# Patient Record
Sex: Female | Born: 1965 | Race: White | Hispanic: No | Marital: Married | Smoking: Former smoker
Health system: Southern US, Community
[De-identification: ages and names within clinical notes are randomized; demographics above are authoritative.]

## PROBLEM LIST (undated history)

## (undated) DIAGNOSIS — S22080A Wedge compression fracture of T11-T12 vertebra, initial encounter for closed fracture: Secondary | ICD-10-CM

## (undated) DIAGNOSIS — E785 Hyperlipidemia, unspecified: Secondary | ICD-10-CM

## (undated) DIAGNOSIS — N870 Mild cervical dysplasia: Secondary | ICD-10-CM

## (undated) DIAGNOSIS — I10 Essential (primary) hypertension: Secondary | ICD-10-CM

## (undated) DIAGNOSIS — M1990 Unspecified osteoarthritis, unspecified site: Secondary | ICD-10-CM

## (undated) DIAGNOSIS — K219 Gastro-esophageal reflux disease without esophagitis: Secondary | ICD-10-CM

## (undated) HISTORY — DX: Mild cervical dysplasia: N87.0

## (undated) HISTORY — DX: Wedge compression fracture of T11-T12 vertebra, initial encounter for closed fracture: S22.080A

---

## 1994-07-14 HISTORY — PX: TUBAL LIGATION: SHX77

## 1998-12-26 ENCOUNTER — Emergency Department (HOSPITAL_COMMUNITY): Admission: EM | Admit: 1998-12-26 | Discharge: 1998-12-26 | Payer: Self-pay | Admitting: Emergency Medicine

## 2001-07-12 ENCOUNTER — Emergency Department (HOSPITAL_COMMUNITY): Admission: EM | Admit: 2001-07-12 | Discharge: 2001-07-12 | Payer: Self-pay | Admitting: Emergency Medicine

## 2002-02-25 ENCOUNTER — Encounter: Admission: RE | Admit: 2002-02-25 | Discharge: 2002-02-25 | Payer: Self-pay | Admitting: Unknown Physician Specialty

## 2002-02-25 ENCOUNTER — Encounter: Payer: Self-pay | Admitting: Unknown Physician Specialty

## 2003-08-10 ENCOUNTER — Emergency Department (HOSPITAL_COMMUNITY): Admission: EM | Admit: 2003-08-10 | Discharge: 2003-08-10 | Payer: Self-pay | Admitting: Emergency Medicine

## 2004-08-14 DIAGNOSIS — N870 Mild cervical dysplasia: Secondary | ICD-10-CM

## 2004-08-14 HISTORY — DX: Mild cervical dysplasia: N87.0

## 2004-08-14 HISTORY — PX: COLPOSCOPY: SHX161

## 2004-11-22 ENCOUNTER — Other Ambulatory Visit: Admission: RE | Admit: 2004-11-22 | Discharge: 2004-11-22 | Payer: Self-pay | Admitting: Obstetrics and Gynecology

## 2005-03-18 ENCOUNTER — Other Ambulatory Visit: Admission: RE | Admit: 2005-03-18 | Discharge: 2005-03-18 | Payer: Self-pay | Admitting: Obstetrics and Gynecology

## 2005-06-20 ENCOUNTER — Other Ambulatory Visit: Admission: RE | Admit: 2005-06-20 | Discharge: 2005-06-20 | Payer: Self-pay | Admitting: Obstetrics and Gynecology

## 2005-11-25 ENCOUNTER — Encounter: Admission: RE | Admit: 2005-11-25 | Discharge: 2005-11-25 | Payer: Self-pay | Admitting: Obstetrics and Gynecology

## 2006-04-27 ENCOUNTER — Ambulatory Visit (HOSPITAL_COMMUNITY): Admission: RE | Admit: 2006-04-27 | Discharge: 2006-04-27 | Payer: Self-pay | Admitting: Family Medicine

## 2006-04-27 ENCOUNTER — Emergency Department (HOSPITAL_COMMUNITY): Admission: EM | Admit: 2006-04-27 | Discharge: 2006-04-27 | Payer: Self-pay | Admitting: Family Medicine

## 2006-08-25 ENCOUNTER — Other Ambulatory Visit: Admission: RE | Admit: 2006-08-25 | Discharge: 2006-08-25 | Payer: Self-pay | Admitting: Obstetrics and Gynecology

## 2007-11-18 ENCOUNTER — Other Ambulatory Visit: Admission: RE | Admit: 2007-11-18 | Discharge: 2007-11-18 | Payer: Self-pay | Admitting: Obstetrics & Gynecology

## 2007-12-13 HISTORY — PX: VAGINAL HYSTERECTOMY: SUR661

## 2008-01-03 ENCOUNTER — Ambulatory Visit (HOSPITAL_COMMUNITY): Admission: RE | Admit: 2008-01-03 | Discharge: 2008-01-04 | Payer: Self-pay | Admitting: Obstetrics & Gynecology

## 2008-01-03 ENCOUNTER — Encounter: Payer: Self-pay | Admitting: Obstetrics & Gynecology

## 2008-11-27 ENCOUNTER — Encounter: Admission: RE | Admit: 2008-11-27 | Discharge: 2008-11-27 | Payer: Self-pay | Admitting: Obstetrics & Gynecology

## 2010-02-27 ENCOUNTER — Encounter: Admission: RE | Admit: 2010-02-27 | Discharge: 2010-02-27 | Payer: Self-pay | Admitting: Obstetrics & Gynecology

## 2010-11-26 NOTE — Discharge Summary (Signed)
NAMEMarland Kitchen  KONNI, KESINGER              ACCOUNT NO.:  [id number]   MEDICAL RECORD NO.:  [id number]          PATIENT TYPE:  OIB   LOCATION:  9303                          FACILITY:  [facility]   PHYSICIAN:  M. Leda Quail, MD  DATE OF BIRTH:  [birth year]   DATE OF ADMISSION:  01/03/2008  DATE OF DISCHARGE:  01/04/2008                               DISCHARGE SUMMARY   Date of Admission:  January 03, 2008  Date of Discharge:  January 04, 2008   PROCEDURE:  Transvaginal hysterectomy.   HISTORY OF PRESENT ILLNESS:  A written H&P is in the chart, but in brief  Ms. Spielberg is a 45 year old G2, P2 married white female with history of  menorrhagia and a fibroid uterus.  She is a smoker and cannot be on oral  contraceptives today, estrogen.  We tried a trial of oral progestins.  Although this did help her bleeding, she had a lot of mood swings and  rage issues with this medication and ultimately decided to discontinue  it.  With discontinuance, her bleeding started back as heavy as it was  before.  She does not like the idea of an IUD and therefore was debating  between endometrial ablation or hysterectomy, and has opted for a  definitive management.  She has had 2 children, her largest weighed 8  pounds 14 ounces and the rest of her children with vaginal deliveries.  I felt that we could either do LAVH or TVH based on how much relaxation  was present when she was asleep.  Risks and benefits have been explained  to the patient, she is ready to proceed.   HOSPITAL COURSE:  The patient was admitted on January 03, 2008, to same-day  surgery.  She was taken to the operating room where a TVH was performed.  The surgery went well and she had about 100 mL of blood loss.  After the  procedure was ended, she was taken to the recovery room and from there  to the third floor for the remainder of her hospitalization.  She was  seen on the evening of postoperative day #0 and was doing very well.  She had no complaints.  She  had some mild nausea earlier.  She had  excellent pain control.  She had made about 900 mL of urine output  during the day.  Vital signs were stable.  She was afebrile.  She had  good bowel sounds and her abdomen was soft.  She had scant bleeding on  her pad.  Foley was discontinued.  She was able to be up and ambulate  and void without difficulty.  Her diet was advanced to regular diet.  In  the morning of postoperative day #1, she had excellent pain control  overnight and was already passing flatus.  She had no complaints.  She  is afebrile with stable vital signs.  She had made 2300 mL of urine  output, most of this voiding on her own.   Postop labs showed a white count of 9.5, hemoglobin of 12.2, and  platelet count of 239.  Her electrolytes  were sodium 136, potassium 4.0,  BUN and creatinine 5 and 0.6, and a glucose of 111.  Her exam was  completely benign.  Her abdomen was soft with excellent bowel sounds.  Perineum was dry.  The patient was voiding, ambulating, tolerating  regular diet, I felt that she could probably be discharged home later  this morning.  She will be advanced to her regular pain medicine.  She  will use Vicodin 5/500, 1-2 tablets p.o. q.4-6 h. p.r.n. pain and Motrin  800 mg 1 every 8 hours as needed for pain.   DISCHARGE INSTRUCTIONS:  These were provided in the written and verbal  form.  The patient has specific reasons to call particularly if she has  a fever greater than 100.5, any heavy bleeding, persistent nausea, or  worsening  pain.  She has numbers to call.  The patient already has her  prescriptions, which are for Vicodin and Motrin as above.  She has a  postop appointment next Monday, on January 10, 2008, at 1:15 with me.  She  will be discharged to home with her husband later this morning.      Lum Keas, MD  Electronically Signed     MSM/MEDQ  D:  01/04/2008  T:  01/04/2008  Job:  [id number]

## 2010-11-26 NOTE — Op Note (Signed)
NAME:  Traci Caldwell, Traci Caldwell              ACCOUNT NO.:  [id number]   MEDICAL RECORD NO.:  [id number]          PATIENT TYPE:  AMB   LOCATION:  SDC                           FACILITY:  [facility]   PHYSICIAN:  M. Leda Quail, MD  DATE OF BIRTH:  [birth year]   DATE OF PROCEDURE:  DATE OF DISCHARGE:                               OPERATIVE REPORT   PREOPERATIVE DIAGNOSES:  25. A 45 year old G2, P2 married white female with history of      menorrhagia.  2. Fibroids.  3. Failed conservative management.  4. Smoker.   POSTOPERATIVE DIAGNOSES:  47. A 45 year old G2, P2 married white female with history of      menorrhagia.  2. Fibroids.  3. Failed conservative management.  4. Smoker.  5. Endometriosis.   PROCEDURE:  Transvaginal hysterectomy.   SURGEON:  M. Leda Quail, MD.   ASSISTANTAram Beecham P. Romine, MD.   ANESTHESIA:  General endotracheal.   FINDINGS:  Globular uterus consistent with fibroids and adenomyosis as  well as left pupil endometriosis.   SPECIMENS:  Cervix and uterus and second specimen of the endometriosis  removed from the left tube.  These were sent separately.   SPECIMEN:  Disposal of Specimens:  Both were sent to pathology.   ESTIMATED BLOOD LOSS:  100 mL.   URINE:  A 200 mL of clear urine output from Foley catheter.   FLUIDS:  2000 mL of LR.   COMPLICATIONS:  None.   INDICATIONS:  A 45 year old G2, P2 married white female with a history  of menorrhagia and fibroids who was seen earlier this year.  She had  been in the clinic in about 2 years.  She complained of  significantly  worsening cycles.  On physical exam, fibroids were noted.  This was  confirmed on ultrasound.  Also, there was evidence on ultrasound  consistent with adenomyosis.  She is a smoker and cannot take estrogen-  containing oral contraceptives.  We discussed treatment options.  I did  not feel an ablation was appropriate for her.  She was not interested in  IUD, so we tried oral  progesterones.  Although, they did help her  bleeding, she had a significant amount of trouble with rage and mood  swings, and I therefore decided to discontinue them and proceed with  definitive management.  Risks and benefits have explained to the  patient.  This was all documented in her office chart and she is here  for the procedure today.   PROCEDURE:  The patient is taken to the operating room.  She is placed  in supine position.  The anesthesia was administered by the anesthesia  staff without difficulty.  Running IV has been placed in her left hand.  Legs positioned in [place] stirrups in the low lithotomy position.  Abdomen, perineum, inner thighs, and vagina prepped and draped in normal  sterile fashion.  Exam under anesthesia is performed and there is good  descensus of the uterus.  Initially, this was posted as a LAVH, but I  felt this could be done vaginally.  Foley catheter was inserted in  the  bladder under sterile conditions.  Legs were positioned in the high  lithotomy position.   Attention is turned to the vagina.  A short heavy-weighted speculum was  placed in the posterior vagina.  Cervix was grasped with a Christella Hartigan  tenaculum.  A 1% lidocaine mixed 1:1 with epinephrine (1:100,000 units)  is instilled circumferentially around the cervix.  The posterior cul-de-  sac was entered sharply.  A figure-of-eight suture is used to attach  posterior peritoneum to the posterior vaginal mucosa.  Long Banano  speculum was then placed in this incision and the short heavy weighted  speculum was removed.  The cervix was circumferentially incised using  cautery.  Then, the anterior vaginal mucosa was visualized.  Using sharp  dissection with curved Mayo scissors beginning at the pubovesical  cervical fascia, dissection is performed.  The plane between the cervix  and the bladder was easily noted.  A Kirby retractor was placed in this  incision and elevated to keep the bladder up out of  the way.   A curved Heaney clamps were used to clamp the uterosacral ligaments  bilaterally.  These pedicles were clamped, transected, and suture  ligated with Heaney stitch of #0 Vicryl.  These were left long.  The  beginning of the cardinal ligament dissection was performed on each side  with care taken with this.  Pedicles were clamped, transected, and  suture ligated with # 0 Vicryl.  Further dissection of the pubovesical  cervical fascia was performed anteriorly as necessary to keep the  bladder out of the way of the dissection.  The cardinal ligaments at  this point were serially clamped, transected, and suture ligated to the  level of the uterine arteries.  These pedicles were then clamped on each  side, transected, and doubly suture ligated with #0 Vicryl.  At this  point, the anterior peritoneum reflection could easily be visualized,  entered sharply and a Kirby retractor was placed in this anteriorly.  This lifted up the bladder.  Then, broad ligament dissection was  performed.  This was necessary to get high enough on the sides of the  uterus, so the uterus could be flipped.  Again, these pedicles were  clamped, transected, and suture ligated with 0-Vicryl.  Once high enough  from the uterus, the fundus of the uterus was grasped with thyroid  tenaculum and delivered through the vaginal incision.  Moist laparotomy  tape was used to push the bowel up out of the way.  Curved Heaney clamps  were placed across the utero-ovarian pedicles bilaterally.  These  pedicles were transected and the uterus and cervix were handed off to be  sent to pathology.  Attention was initially turned to the right utero-  ovarian pedicle.  This was suture ligated first with free tie of #0  Vicryl and a stitch tie.  There was a small amount of bleeding that was  up higher on the pedicle where some of the broad ligament had torn.  This bleeding was grasped with Allis clamp.  Two figure-of-eight sutures   of #2-0 Vicryl were necessary to make this complete hemostatic.  The  pedicle was then left and watched with no bleeding being noted.  Then,  in a similar fashion on the right pedicle, the pedicle was tied with a  free tie of #0 Vicryl and then a stitch tie of #0 Vicryl.  This pedicle  was also held onto to watch for any bleeding.  It was completely  hemostatic.  There was a small amount of bleeding on the posterior cuff,  but this would be incorporated into the stitch around the cuff.  At this  point, before the pedicles were cut to release the ovaries, there was a  small bluish-looking area on the fallopian tube on the patient's left.  This almost looked like an inclusion cyst.  It was opened with cautery  and there was some endometriotic-looking fluid there.  The area was  elevated, incised off using cautery.  The area on the tube was made  hemostatic with cautery.  This tissue specimen was sent off as a left  fallopian tube endometriosis specimen.  At this point, the short  weighted speculum was placed back in the vagina.  Anterior peritoneum  was grasped and using a long #0 stitch, the cuff was run starting at the  2 o'clock position incorporating the anterior mucosa and the anterior  peritoneum.  The stitch was run around the cervix in a running  interlocking fashion.  I incorporated the posterior vaginal mucosa and  posterior peritoneum and then end at the 10 o'clock position.  Stitch  was tied tightly.  Warm normal saline was used to irrigate the vaginal  incision.  There was small amount of bleeding on the cuff that was it.  The pedicles including the cardinal, the uterine artery, and the utero-  ovarian pedicles appeared hemostatic.   A enterocele stitch was placed using a long #0 stitch.  This was brought  to the posterior vaginal mucosa and posterior peritoneum.  The medial  third of the right uterosacral ligament was incorporated.  The posterior  peritoneum was reached across  and the medial third of the right  uterosacral ligament was incorporated.  The stitch was brought back to  the posterior vaginal mucosa was tied tightly bringing the uterosacral  ligaments together with posterior vaginal mucosa.   The cuff was then closed with 4 figure-of-eight sutures of #0 Vicryl.  The cuff appeared hemostatic.  The vagina was irrigated.  No bleeding  was noted.   The legs were positioned back in the supine position.  The Betadine was  cleansed off the skin.  Sponge, lap, needle, and the instrument counts  were correct x2.  The patient tolerated the procedure well.  She was  then awakened and extubated from anesthesia without difficulty.      Lum Keas, MD  Electronically Signed     MSM/MEDQ  D:  01/03/2008  T:  01/04/2008  Job:  [id number]

## 2010-11-29 NOTE — Discharge Summary (Signed)
NAMEMarland Kitchen  Traci Caldwell, Traci Caldwell              ACCOUNT NO.:  [id number]   MEDICAL RECORD NO.:  [id number]          PATIENT TYPE:  OIB   LOCATION:  9303                          FACILITY:  [facility]   PHYSICIAN:  M. Leda Quail, MD  DATE OF BIRTH:  17-Apr-1966   DATE OF ADMISSION:  01/03/2008  DATE OF DISCHARGE:  01/04/2008                               DISCHARGE SUMMARY   ADMISSION DIAGNOSES:  63. A 44 year old G2, P2 married white female with menorrhagia.  2. Fibroid uterus.  3. Smoking history.  4. Inability to tolerate progestin therapy.  5. History of dysplasia of the cervix.   DISCHARGE DIAGNOSES:  42. A 45 year old G2, P2 married white female with menorrhagia.  2. Fibroid uterus.  3. Smoking history.  4. Inability to tolerate progestin therapy.  5. History of dysplasia of the cervix.   HISTORY OF PRESENT ILLNESS:  In brief, Traci Caldwell is a 45 year old G2,  P2 married white female with menorrhagia and fibroid uterus.  She has  been treated conservatively with progestin therapy, which did improve  her cycle.  She has had difficulty tolerating due to the side effects.  She has ultimately decided to proceed with a definitive management.  She  is a noncandidate for estrogen and oral contraceptives because of  smoking and based on her exam, I do feel that this is a sensible choice  for her to make.  A written H&P is in the chart.   HOSPITAL COURSE:  The patient is admitted to Same-Day Surgery.  She was  taken to operating room where a transvaginal hysterectomy was performed  without difficulty.  She did have globular uterus consistent with  fibroids and adenomyosis.  The left tube did have endometriosis present  as well, and this was excised and sent to pathology.  She had  approximately 100 mL of blood loss and did very well during the surgery.  After, she was taken from the operating room to the recovery room and  from there to the third floor for the remainder of her hospitalization.  She  had a benign postoperative course.  In the morning of postop day #1,  she was doing well.  She had stable vital signs.  She made 2300 mL of  urine output.  Abdomen was soft and showed excellent bowel sounds.  She  had minimal bleeding.  Postop labs showed a white count of 95,  hemoglobin of 12.2, and platelets of 239.  Sodium was 136, potassium  4.0, BUN and creatinine 5 and 0.6 respectively, and glucose of 1.1.  She  was able to transition to a regular diet very easily and to transition  to oral pain medicines.  She was able to ambulate without difficulty and  was able to take all medicines by mouth.  Her Foley catheter was  discontinued.  She was able to void well.  She was also on the PCA and  her IV fluids were removed.  By midday, I felt she was stable for  discharge to home.  She was sent home with her husband.   DISCHARGE MEDICATIONS:  Motrin 800 mg  1 p.o. q.8 h. p.r.n. pain; #30, no  refills, and Vicodin 5/500 one each tab p.o. q.4-6 h. p.r.n. pain.  The  patient will see me in 1 week for followup appointment.  She has been  given written instructions as well as verbal instructions.      Traci Keas, MD  Electronically Signed     MSM/MEDQ  D:  01/24/2008  T:  01/24/2008  Job:  [id number]

## 2011-03-04 ENCOUNTER — Other Ambulatory Visit: Payer: Self-pay | Admitting: Obstetrics & Gynecology

## 2011-03-04 DIAGNOSIS — Z1231 Encounter for screening mammogram for malignant neoplasm of breast: Secondary | ICD-10-CM

## 2011-03-05 ENCOUNTER — Ambulatory Visit
Admission: RE | Admit: 2011-03-05 | Discharge: 2011-03-05 | Disposition: A | Discharge status: Home / Self Care | Payer: BC Managed Care – PPO | Source: Ambulatory Visit | Attending: Obstetrics & Gynecology | Admitting: Obstetrics & Gynecology

## 2011-03-05 DIAGNOSIS — Z1231 Encounter for screening mammogram for malignant neoplasm of breast: Secondary | ICD-10-CM

## 2011-04-10 LAB — URINALYSIS, ROUTINE W REFLEX MICROSCOPIC
Glucose, UA: NEGATIVE
Hgb urine dipstick: NEGATIVE
Specific Gravity, Urine: <1.005 — ABNORMAL LOW
Urobilinogen, UA: 0.2
pH: 5.5

## 2011-04-10 LAB — BASIC METABOLIC PANEL
BUN: 5 — ABNORMAL LOW
CO2: 21
Calcium: 8.1 — ABNORMAL LOW
Calcium: 9.4
Creatinine, Ser: 0.65
Creatinine, Ser: 0.66
GFR calc Af Amer: >60
GFR calc non Af Amer: >60
Glucose, Bld: 111 — ABNORMAL HIGH
Sodium: 136

## 2011-04-10 LAB — CBC
MCHC: 34.2
Platelets: 239
RBC: 4.69
RDW: 13.2
RDW: 13.3

## 2011-04-10 LAB — PROTIME-INR
INR: 0.9
Prothrombin Time: 12.3

## 2011-04-10 LAB — TYPE AND SCREEN
ABO/RH(D): O POS
Antibody Screen: NEGATIVE

## 2011-04-10 LAB — ABO/RH: ABO/RH(D): O POS

## 2012-01-27 ENCOUNTER — Other Ambulatory Visit: Payer: Self-pay | Admitting: Obstetrics & Gynecology

## 2012-01-27 DIAGNOSIS — Z1231 Encounter for screening mammogram for malignant neoplasm of breast: Secondary | ICD-10-CM

## 2012-03-08 ENCOUNTER — Ambulatory Visit
Admission: RE | Admit: 2012-03-08 | Discharge: 2012-03-08 | Disposition: A | Discharge status: Home / Self Care | Payer: BC Managed Care – PPO | Source: Ambulatory Visit | Attending: Obstetrics & Gynecology | Admitting: Obstetrics & Gynecology

## 2012-03-08 DIAGNOSIS — Z1231 Encounter for screening mammogram for malignant neoplasm of breast: Secondary | ICD-10-CM

## 2012-11-15 ENCOUNTER — Ambulatory Visit: Payer: Self-pay | Admitting: Obstetrics & Gynecology

## 2013-01-12 ENCOUNTER — Ambulatory Visit (INDEPENDENT_AMBULATORY_CARE_PROVIDER_SITE_OTHER): Payer: BC Managed Care – PPO | Admitting: Obstetrics & Gynecology

## 2013-01-12 ENCOUNTER — Encounter: Payer: Self-pay | Admitting: Obstetrics & Gynecology

## 2013-01-12 VITALS — BP 126/76 | HR 70 | Resp 14 | Ht 64.0 in | Wt 177.0 lb

## 2013-01-12 DIAGNOSIS — Z0000 Encounter for general adult medical examination without abnormal findings: Secondary | ICD-10-CM

## 2013-01-12 DIAGNOSIS — Z01419 Encounter for gynecological examination (general) (routine) without abnormal findings: Secondary | ICD-10-CM

## 2013-01-12 LAB — POCT URINALYSIS DIPSTICK
Leukocytes, UA: NEGATIVE
Urobilinogen, UA: NEGATIVE

## 2013-01-12 LAB — HEMOGLOBIN, FINGERSTICK: Hemoglobin, fingerstick: 15.3 g/dL (ref 12.0–16.0)

## 2013-01-12 NOTE — Patient Instructions (Signed)

## 2013-01-12 NOTE — Progress Notes (Signed)
47 y.o. [id number] MarriedCaucasianF here for annual exam.  No vaginal bleeding.  Still not smoking!!!  Husband has urologist surgery this afternoon.  Doing really well.  Occasionally has night sweats.  Sleeping OK.  Not interested in HRT right now.   No LMP recorded. Patient has had a hysterectomy.          Sexually active: yes  The current method of family planning is status post hysterectomy.    Exercising: yes  is active at work Smoker:  No former smoker quit 3 years ago  Health Maintenance: Pap:  11/18/07 - NEG, TVH History of abnormal Pap:  Yes ASCUS 06/2004; Colpo 08/2004 CIN I MMG:  03/08/12 Colonoscopy:  no BMD:   no TDaP:  9 years ago  Screening Labs: yes , Hb today: 15.3  Urine today: Negative   reports that she has quit smoking. She does not have any smokeless tobacco history on file. She reports that she does not drink alcohol or use illicit drugs.  Past Medical History  Diagnosis Date  . 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 Vaginal hysterectomy  12/2007    TVH- due to fibroids & bleeding  . Tubal ligation  1996    Current Outpatient Prescriptions  Medication Sig Dispense Refill  . Ibuprofen (ADVIL PO) Take by mouth as needed.       No current facility-administered medications for this visit.    Family History  Problem Relation Age of Onset  . Hypertension Mother   . Hypertension Father   . Endometriosis Sister     ROS:  Pertinent items are noted in HPI.  Otherwise, a comprehensive ROS was negative.  Exam:   BP 126/76  Pulse 70  Resp 14  Ht 5\' 4"  (1.626 m)  Wt 177 lb (80.287 kg)  BMI 30.37 kg/m2  Weight change +1  Height: 5\' 4"  (162.6 cm)  Ht Readings from Last 3 Encounters:  01/12/13 5\' 4"  (1.626 m)    General appearance: alert, cooperative and appears stated age Head: Normocephalic, without obvious abnormality, atraumatic Neck: no adenopathy, supple,  symmetrical, trachea midline and thyroid normal to inspection and palpation Lungs: clear to auscultation bilaterally Breasts: normal appearance, no masses or tenderness Heart: regular rate and rhythm Abdomen: soft, non-tender; bowel sounds normal; no masses,  no organomegaly Extremities: extremities normal, atraumatic, no cyanosis or edema Skin: Skin color, texture, turgor normal. No rashes or lesions Lymph nodes: Cervical, supraclavicular, and axillary nodes normal. No abnormal inguinal nodes palpated Neurologic: Grossly normal   Pelvic: External genitalia:  no lesions              Urethra:  normal appearing urethra with no masses, tenderness or lesions              Bartholins and Skenes: normal                 Vagina: normal appearing vagina with normal color and discharge, no lesions              Cervix: absent              Pap taken: no Bimanual Exam:  Uterus:  uterus absent              Adnexa: normal adnexa and no mass, fullness, tenderness               Rectovaginal: Confirms  Anus:  normal sphincter tone, no lesions  A:  Well Woman with normal exam H/O TVH Former smoker Mild vasomotor symptoms  P:   Mammogram yearly. No pap smear. Labs last year good. return annually or prn  An After Visit Summary was printed and given to the patient.

## 2014-02-06 ENCOUNTER — Other Ambulatory Visit: Payer: Self-pay

## 2014-02-06 DIAGNOSIS — Z1231 Encounter for screening mammogram for malignant neoplasm of breast: Secondary | ICD-10-CM

## 2014-02-13 ENCOUNTER — Encounter (HOSPITAL_COMMUNITY): Payer: Self-pay | Admitting: Emergency Medicine

## 2014-02-13 ENCOUNTER — Emergency Department (INDEPENDENT_AMBULATORY_CARE_PROVIDER_SITE_OTHER)
Admission: EM | Admit: 2014-02-13 | Discharge: 2014-02-13 | Disposition: A | Discharge status: Home / Self Care | Payer: BC Managed Care – PPO | Source: Home / Self Care | Attending: Family Medicine | Admitting: Family Medicine

## 2014-02-13 DIAGNOSIS — J069 Acute upper respiratory infection, unspecified: Secondary | ICD-10-CM

## 2014-02-13 MED ORDER — GUAIFENESIN-DM 100-10 MG/5ML PO SYRP: 5.0000 mL | ORAL_SOLUTION | ORAL | Status: DC | PRN

## 2014-02-13 NOTE — ED Provider Notes (Signed)
CSN: [id number]     Arrival date & time 02/13/14  1311 History   First MD Initiated Contact with Patient 02/13/14 1534     Chief Complaint  Patient presents with  . URI   (Consider location/radiation/quality/duration/timing/severity/associated sxs/prior Treatment) Patient is a 48 y.o. female presenting with URI. The history is provided by the patient.  URI Presenting symptoms: cough and rhinorrhea   Presenting symptoms: no ear pain, no facial pain, no fever and no sore throat   Severity:  Mild Onset quality:  Sudden Duration:  4 days Timing:  Intermittent Progression:  Unchanged Chronicity:  New Relieved by:  Decongestant Associated symptoms: headaches   Associated symptoms: no neck pain, no swollen glands and no wheezing   Risk factors: no sick contacts     Past Medical History  Diagnosis Date  . 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 Vaginal hysterectomy  12/2007    TVH- due to fibroids & bleeding  . Tubal ligation  1996  . Colposcopy  08/2004    CIN I   Family History  Problem Relation Age of Onset  . Hypertension Mother   . Hypertension Father   . Endometriosis Sister    History  Substance Use Topics  . Smoking status: Former Research scientist (life sciences)  . Smokeless tobacco: Not on file  . Alcohol Use: No   OB History   Grav Para Term Preterm Abortions TAB SAB Ect Mult Living   2 2 2       2      Review of Systems  Constitutional: Negative for fever and activity change.  HENT: Positive for rhinorrhea. Negative for ear pain and sore throat.   Respiratory: Positive for cough. Negative for wheezing.   Musculoskeletal: Negative for neck pain.  Skin: Negative for rash.  Neurological: Positive for headaches.    Allergies  Review of patient's allergies indicates no known allergies.  Home Medications   Prior to Admission medications   Medication Sig Start Date End Date Taking?  Authorizing Provider  guaiFENesin-dextromethorphan (ROBITUSSIN DM) 100-10 MG/5ML syrup Take 5 mLs by mouth every 4 (four) hours as needed for cough. 02/13/14   Rosemarie Ax, MD  Ibuprofen (ADVIL PO) Take by mouth as needed.    Historical Provider, MD   BP 173/110  Pulse 66  Temp(Src) 98.7 F (37.1 C) (Oral)  Resp 16  Ht 5\' 3"  (1.6 m)  Wt 170 lb (77.111 kg)  BMI 30.12 kg/m2  SpO2 99% Physical Exam  Constitutional: She is oriented to person, place, and time. She appears well-developed and well-nourished. No distress.  HENT:  Head: Normocephalic and atraumatic.  Mouth/Throat: No oropharyngeal exudate.  Turbinates erythematous b/l.   Neck: Normal range of motion. Neck supple.  Cardiovascular: Normal rate and regular rhythm.   Pulmonary/Chest: Effort normal and breath sounds normal. She has no wheezes. She has no rales.  Lymphadenopathy:    She has no cervical adenopathy.  Neurological: She is alert and oriented to person, place, and time.  Skin: Skin is warm and dry. No rash noted.    ED Course  Procedures (including critical care time) Labs Review Labs Reviewed - No data to display  Imaging Review No results found.   MDM   1. URI (upper respiratory infection)    Symptoms consistent for URI with possible allergy component. Will try guaifenesin. Patient has advil cold and sinus at home that she will also  try. Nasal saline rinse and gargling with salt water are other things she could try that would be cost effective.      Rosemarie Ax, MD 02/13/14 1600

## 2014-02-13 NOTE — ED Notes (Signed)
Pt c/o cold sx onset Friday  Sx include: ST, dry cough, HA, runny nose Denies f/v/n/d Taking OTC cold meds w/no relief Alert w/no signs of acute distress.

## 2014-02-13 NOTE — ED Provider Notes (Signed)
Medical screening examination/treatment/procedure(s) were performed by resident physician or non-physician practitioner and as supervising physician I was immediately available for consultation/collaboration.   Pauline Good MD.   Billy Fischer, MD 02/13/14 2028

## 2014-02-13 NOTE — Discharge Instructions (Signed)
Upper Respiratory Infection, Adult An upper respiratory infection (URI) is also known as the common cold. It is often caused by a type of germ (virus). Colds are easily spread (contagious). You can pass it to others by kissing, coughing, sneezing, or drinking out of the same glass. Usually, you get better in 1 or 2 weeks.  HOME CARE   Only take medicine as told by your doctor.  Use a warm mist humidifier or breathe in steam from a hot shower.  Drink enough water and fluids to keep your pee (urine) clear or pale yellow.  Get plenty of rest.  Return to work when your temperature is back to normal or as told by your doctor. You may use a face mask and wash your hands to stop your cold from spreading. GET HELP RIGHT AWAY IF:   After the first few days, you feel you are getting worse.  You have questions about your medicine.  You have chills, shortness of breath, or brown or red spit (mucus).  You have yellow or brown snot (nasal discharge) or pain in the face, especially when you bend forward.  You have a fever, puffy (swollen) neck, pain when you swallow, or white spots in the back of your throat.  You have a bad headache, ear pain, sinus pain, or chest pain.  You have a high-pitched whistling sound when you breathe in and out (wheezing).  You have a lasting cough or cough up blood.  You have sore muscles or a stiff neck. MAKE SURE YOU:   Understand these instructions.  Will watch your condition.  Will get help right away if you are not doing well or get worse. Document Released: 12/17/2007 Document Revised: 09/22/2011 Document Reviewed: 10/05/2013 Exit[employer] Patient Information 2015 [employer]. This information is not intended to replace advice given to you by your health care provider. Make sure you discuss any questions you have with your health care provider.  

## 2014-03-08 ENCOUNTER — Ambulatory Visit
Admission: RE | Admit: 2014-03-08 | Discharge: 2014-03-08 | Disposition: A | Discharge status: Home / Self Care | Payer: BC Managed Care – PPO | Source: Ambulatory Visit

## 2014-03-08 DIAGNOSIS — Z1231 Encounter for screening mammogram for malignant neoplasm of breast: Secondary | ICD-10-CM

## 2014-03-09 ENCOUNTER — Other Ambulatory Visit: Payer: Self-pay | Admitting: Obstetrics & Gynecology

## 2014-03-09 DIAGNOSIS — R928 Other abnormal and inconclusive findings on diagnostic imaging of breast: Secondary | ICD-10-CM

## 2014-03-16 ENCOUNTER — Ambulatory Visit
Admission: RE | Admit: 2014-03-16 | Discharge: 2014-03-16 | Disposition: A | Discharge status: Home / Self Care | Payer: BC Managed Care – PPO | Source: Ambulatory Visit | Attending: Obstetrics & Gynecology | Admitting: Obstetrics & Gynecology

## 2014-03-16 DIAGNOSIS — R928 Other abnormal and inconclusive findings on diagnostic imaging of breast: Secondary | ICD-10-CM

## 2014-03-21 ENCOUNTER — Other Ambulatory Visit: Payer: BC Managed Care – PPO

## 2014-03-21 ENCOUNTER — Ambulatory Visit (INDEPENDENT_AMBULATORY_CARE_PROVIDER_SITE_OTHER): Payer: BC Managed Care – PPO | Admitting: Obstetrics & Gynecology

## 2014-03-21 ENCOUNTER — Encounter: Payer: Self-pay | Admitting: Obstetrics & Gynecology

## 2014-03-21 VITALS — BP 128/82 | HR 60 | Resp 16 | Ht 64.0 in | Wt 178.6 lb

## 2014-03-21 DIAGNOSIS — Z01419 Encounter for gynecological examination (general) (routine) without abnormal findings: Secondary | ICD-10-CM

## 2014-03-21 DIAGNOSIS — Z0000 Encounter for general adult medical examination without abnormal findings: Secondary | ICD-10-CM

## 2014-03-21 LAB — POCT URINALYSIS DIPSTICK
BILIRUBIN UA: NEGATIVE
GLUCOSE UA: NEGATIVE
KETONES UA: NEGATIVE
Leukocytes, UA: NEGATIVE
Nitrite, UA: NEGATIVE
Protein, UA: NEGATIVE
UROBILINOGEN UA: NEGATIVE
pH, UA: 5

## 2014-03-21 LAB — HEMOGLOBIN, FINGERSTICK: HEMOGLOBIN, FINGERSTICK: 14.2 g/dL (ref 12.0–16.0)

## 2014-03-21 NOTE — Progress Notes (Signed)
48 y.o. [id number] MarriedCaucasianF here for annual exam.  Doing well.  Had "abnormal MMG" in 9/15.  Follow up was done 03/16/14.  This was all normal.  No vaginal bleeding.     Patient's last menstrual period was 12/13/2007.          Sexually active: Yes.    The current method of family planning is status post hysterectomy.    Exercising: Yes.    walking Smoker:  Former smoker  Health Maintenance: Pap:  11/18/07 WNL History of abnormal Pap:  Yes h/o CIN I.  Pathology with hysterectomy was normal.   MMG:  03/08/14-MMG, 03/16/14-left diag-screening in one year Colonoscopy:  none BMD:   none TDaP:  2007 Screening Labs: 2013, Hb today: 14.2, Urine today: RBC-trace   reports that she has quit smoking. She has never used smokeless tobacco. She reports that she does not drink alcohol or use illicit drugs.  Past Medical History  Diagnosis Date  . 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 Vaginal hysterectomy  12/2007    TVH- due to fibroids & bleeding  . Tubal ligation  1996  . Colposcopy  08/2004    CIN I    Current Outpatient Prescriptions  Medication Sig Dispense Refill  . Ibuprofen (ADVIL PO) Take by mouth as needed.       No current facility-administered medications for this visit.    Family History  Problem Relation Age of Onset  . Hypertension Mother   . Hypertension Father   . Endometriosis Sister     ROS:  Pertinent items are noted in HPI.  Otherwise, a comprehensive ROS was negative.  Exam:   BP 128/82  Pulse 60  Resp 16  Ht 5\' 4"  (1.626 m)  Wt 178 lb 9.6 oz (81.012 kg)  BMI 30.64 kg/m2  LMP 12/13/2007  Weight change:  -1# Height: 5\' 4"  (162.6 cm)  Ht Readings from Last 3 Encounters:  03/21/14 5\' 4"  (1.626 m)  02/13/14 5\' 3"  (1.6 m)  01/12/13 5\' 4"  (1.626 m)    General appearance: alert, cooperative and appears stated age Head: Normocephalic, without obvious  abnormality, atraumatic Neck: no adenopathy, supple, symmetrical, trachea midline and thyroid normal to inspection and palpation Lungs: clear to auscultation bilaterally Breasts: normal appearance, no masses or tenderness Heart: regular rate and rhythm Abdomen: soft, non-tender; bowel sounds normal; no masses,  no organomegaly Extremities: extremities normal, atraumatic, no cyanosis or edema Skin: Skin color, texture, turgor normal. No rashes or lesions Lymph nodes: Cervical, supraclavicular, and axillary nodes normal. No abnormal inguinal nodes palpated Neurologic: Grossly normal   Pelvic: External genitalia:  no lesions              Urethra:  normal appearing urethra with no masses, tenderness or lesions              Bartholins and Skenes: normal                 Vagina: normal appearing vagina with normal color and discharge, no lesions              Cervix: absent              Pap taken: No. Bimanual Exam:  Uterus:  uterus absent              Adnexa: no mass, fullness, tenderness  Rectovaginal: Confirms               Anus:  normal sphincter tone, no lesions  A:  Well Woman with normal exam  H/O TVH  Former smoker  Mild vasomotor symptoms   P: Mammogram yearly.  No pap smear.  Labs normal 2013.  No labs today. return annually or prn   An After Visit Summary was printed and given to the patient.

## 2014-05-15 ENCOUNTER — Encounter: Payer: Self-pay | Admitting: Obstetrics & Gynecology

## 2015-03-26 ENCOUNTER — Other Ambulatory Visit: Payer: Self-pay

## 2015-03-26 DIAGNOSIS — Z1231 Encounter for screening mammogram for malignant neoplasm of breast: Secondary | ICD-10-CM

## 2015-04-16 ENCOUNTER — Ambulatory Visit
Admission: RE | Admit: 2015-04-16 | Discharge: 2015-04-16 | Disposition: A | Discharge status: Home / Self Care | Payer: BLUE CROSS/BLUE SHIELD | Source: Ambulatory Visit

## 2015-04-16 DIAGNOSIS — Z1231 Encounter for screening mammogram for malignant neoplasm of breast: Secondary | ICD-10-CM

## 2015-04-17 ENCOUNTER — Ambulatory Visit: Payer: Self-pay

## 2015-04-27 ENCOUNTER — Encounter: Payer: Self-pay | Admitting: Obstetrics & Gynecology

## 2015-04-27 ENCOUNTER — Ambulatory Visit (INDEPENDENT_AMBULATORY_CARE_PROVIDER_SITE_OTHER): Payer: BLUE CROSS/BLUE SHIELD | Admitting: Obstetrics & Gynecology

## 2015-04-27 VITALS — BP 142/90 | HR 64 | Resp 16 | Ht 63.75 in | Wt 181.0 lb

## 2015-04-27 DIAGNOSIS — Z01419 Encounter for gynecological examination (general) (routine) without abnormal findings: Secondary | ICD-10-CM | POA: Diagnosis not present

## 2015-04-27 DIAGNOSIS — Z0000 Encounter for general adult medical examination without abnormal findings: Secondary | ICD-10-CM | POA: Diagnosis not present

## 2015-04-27 LAB — COMPREHENSIVE METABOLIC PANEL
ALT: 11 U/L (ref 6–29)
AST: 13 U/L (ref 10–35)
Albumin: 4.1 g/dL (ref 3.6–5.1)
Alkaline Phosphatase: 55 U/L (ref 33–115)
BUN: 9 mg/dL (ref 7–25)
CHLORIDE: 104 mmol/L (ref 98–110)
CO2: 24 mmol/L (ref 20–31)
Calcium: 9.4 mg/dL (ref 8.6–10.2)
Creat: 0.68 mg/dL (ref 0.50–1.10)
GLUCOSE: 80 mg/dL (ref 65–99)
POTASSIUM: 3.8 mmol/L (ref 3.5–5.3)
Sodium: 138 mmol/L (ref 135–146)
TOTAL PROTEIN: 6.8 g/dL (ref 6.1–8.1)
Total Bilirubin: 0.9 mg/dL (ref 0.2–1.2)

## 2015-04-27 LAB — LIPID PANEL
Cholesterol: 224 mg/dL — ABNORMAL HIGH (ref 125–200)
HDL: 76 mg/dL (ref >=46)
LDL CALC: 133 mg/dL — AB (ref <130)
Total CHOL/HDL Ratio: 2.9 Ratio (ref <=5.0)
Triglycerides: 76 mg/dL (ref <150)
VLDL: 15 mg/dL (ref <30)

## 2015-04-27 LAB — POCT URINALYSIS DIPSTICK
Bilirubin, UA: NEGATIVE
Glucose, UA: NEGATIVE
KETONES UA: NEGATIVE
Leukocytes, UA: NEGATIVE
Nitrite, UA: NEGATIVE
PH UA: 7
PROTEIN UA: NEGATIVE
RBC UA: NEGATIVE
UROBILINOGEN UA: NEGATIVE

## 2015-04-27 LAB — HEMOGLOBIN, FINGERSTICK: HEMOGLOBIN, FINGERSTICK: 13.5 g/dL (ref 12.0–16.0)

## 2015-04-27 LAB — TSH: TSH: 1.676 u[IU]/mL (ref 0.350–4.500)

## 2015-04-27 NOTE — Addendum Note (Signed)
Addended by: Robley Fries on: 04/27/2015 03:10 PM   Modules accepted: Miquel Dunn

## 2015-04-27 NOTE — Progress Notes (Signed)
49 y.o. [id number] MarriedCaucasianF here for annual exam.  Denies vaginal bleeding.  Pt doing well.  Does need blood work done today.    Patient's last menstrual period was 12/13/2007.          Sexually active: Yes.    The current method of family planning is status post hysterectomy.    Exercising: Yes.    running at work all day Smoker:  Former smoker  Health Maintenance: Pap:  5/09 WNL  History of abnormal Pap:  Yes ASCUS 12/05, Colpo 2/06 CIN I MMG:  04/16/15 BiRads 1-negative Colonoscopy:  none BMD:   none TDaP:  2007 Screening Labs: today, Hb today: 13.5, Urine today: PH-7.0   reports that she has quit smoking. She has never used smokeless tobacco. She reports that she does not drink alcohol or use illicit drugs.  Past Medical History  Diagnosis Date  . 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 Vaginal hysterectomy  12/2007    TVH- due to fibroids & bleeding  . Tubal ligation  1996  . Colposcopy  08/2004    CIN I    Current Outpatient Prescriptions  Medication Sig Dispense Refill  . Ibuprofen (ADVIL PO) Take by mouth as needed.     No current facility-administered medications for this visit.    Family History  Problem Relation Age of Onset  . Hypertension Mother   . Hypertension Father   . Endometriosis Sister     ROS:  Pertinent items are noted in HPI.  Otherwise, a comprehensive ROS was negative.  Exam:   BP 150/90 mmHg  Pulse 64  Resp 16  Ht 5' 3.75" (1.619 m)  Wt 181 lb (82.101 kg)  BMI 31.32 kg/m2  LMP 12/13/2007  Weight change: +3#  Height: 5' 3.75" (161.9 cm)  Ht Readings from Last 3 Encounters:  04/27/15 5' 3.75" (1.619 m)  03/21/14 5\' 4"  (1.626 m)  02/13/14 5\' 3"  (1.6 m)    General appearance: alert, cooperative and appears stated age Head: Normocephalic, without obvious abnormality, atraumatic Neck: no adenopathy, supple, symmetrical, trachea midline  and thyroid normal to inspection and palpation Lungs: clear to auscultation bilaterally Breasts: normal appearance, no masses or tenderness Heart: regular rate and rhythm Abdomen: soft, non-tender; bowel sounds normal; no masses,  no organomegaly Extremities: extremities normal, atraumatic, no cyanosis or edema Skin: Skin color, texture, turgor normal. No rashes or lesions Lymph nodes: Cervical, supraclavicular, and axillary nodes normal. No abnormal inguinal nodes palpated Neurologic: Grossly normal   Pelvic: External genitalia:  no lesions              Urethra:  normal appearing urethra with no masses, tenderness or lesions              Bartholins and Skenes: normal                 Vagina: normal appearing vagina with normal color and discharge, no lesions              Cervix: absent              Pap taken: No. Bimanual Exam:  Uterus:  uterus absent              Adnexa: normal adnexa and no mass, fullness, tenderness               Rectovaginal: Confirms  Anus:  normal sphincter tone, no lesions  Chaperone was present for exam.  A:  Well Woman with normal exam  H/O TVH, CIN 1 on biopsy 2006, negative pathology with hysterctomy  Former smoker  Grade D breast density  P: Mammogram yearly.  D/w pt 3D MMG due to breast density. No pap smear CMP, TSH, Vit D, Lipids today Pt will start taking AM BP.  If > 140/90, she will let me know. Return annually or prn

## 2015-04-28 LAB — VITAMIN D 25 HYDROXY (VIT D DEFICIENCY, FRACTURES): Vit D, 25-Hydroxy: 8 ng/mL — ABNORMAL LOW (ref 30–100)

## 2015-04-30 ENCOUNTER — Telehealth: Payer: Self-pay

## 2015-04-30 MED ORDER — VITAMIN D (ERGOCALCIFEROL) 1.25 MG (50000 UNIT) PO CAPS: 50000.0000 [IU] | ORAL_CAPSULE | ORAL | Status: DC

## 2015-04-30 NOTE — Telephone Encounter (Signed)
Patient returning [facility] call she can be reached at [phone] ext [phone]

## 2015-04-30 NOTE — Telephone Encounter (Signed)
Patient notified of results. RX for Vitamin D 50,000IU weekly sent to pharmacy. 3 month recheck scheduled for 07/20/15.//kn

## 2015-04-30 NOTE — Telephone Encounter (Signed)
Lmtcb//kn 

## 2015-04-30 NOTE — Telephone Encounter (Signed)
-----   Message from Megan Salon, MD sent at 04/29/2015  1:57 PM EDT ----- Please inform Vit D very low.  Needs 50K weekly x 12 weeks and repeat 3 months.  CMP normal . TSH normal.  Lipids mildly elevated with total 224 and LDL 133 but HDS are good and ratio is good.  Repeat 1-2 years but should repeat fasting.

## 2015-06-12 ENCOUNTER — Telehealth: Payer: Self-pay

## 2015-06-12 NOTE — Telephone Encounter (Signed)
-----   Message from Megan Salon, MD sent at 04/30/2015  5:31 PM EDT ----- This is ok.  Please document in phone note and I will sign.  That way it is in the chart.  I would ask her if the has a BP cuff at home (not sure she does) to bring it with her with future appt and we will make sure this is accurate.  Thanks.  Vinnie Level ----- Message -----    From: Robley Fries, CMA    Sent: 04/30/2015   3:15 PM      To: Megan Salon, MD  Dr Sabra Heck, she was going to check her BP at home. She did over the weekend and it was 126/89. She said this is usually her normal. Please advise.//kn

## 2015-07-20 ENCOUNTER — Other Ambulatory Visit: Payer: BLUE CROSS/BLUE SHIELD

## 2015-08-17 ENCOUNTER — Other Ambulatory Visit: Payer: BLUE CROSS/BLUE SHIELD

## 2015-08-24 ENCOUNTER — Other Ambulatory Visit (INDEPENDENT_AMBULATORY_CARE_PROVIDER_SITE_OTHER): Payer: BLUE CROSS/BLUE SHIELD

## 2015-08-24 DIAGNOSIS — E559 Vitamin D deficiency, unspecified: Secondary | ICD-10-CM

## 2015-08-25 LAB — VITAMIN D 25 HYDROXY (VIT D DEFICIENCY, FRACTURES): Vit D, 25-Hydroxy: 27 ng/mL — ABNORMAL LOW (ref 30–100)

## 2015-09-03 ENCOUNTER — Telehealth: Payer: Self-pay | Admitting: *Deleted

## 2015-09-03 NOTE — Telephone Encounter (Signed)
Notes Recorded by Elroy Channel, CMA on 09/03/2015 at 10:18 AM LM for pt to call back. Rx not sent yet Notes Recorded by Megan Salon, MD on 09/01/2015 at 6:31 AM Please let pt know her Vit D is better. Was 8 and is now 29. Needs to stay on the prescription Vit D 50K weekly and will recheck level next year. Order has not been sent to pharmacy. Thanks.

## 2015-09-06 NOTE — Telephone Encounter (Signed)
LM for pt to call back. Second attempt  

## 2015-09-10 NOTE — Telephone Encounter (Signed)
Left Message to call back. Third attempt. Unable to reach letter sent to patient.   Dr. Lestine Box  - Encounter closed.

## 2015-09-13 NOTE — Telephone Encounter (Signed)
Patient called back and was notified of results.

## 2016-04-16 ENCOUNTER — Encounter (HOSPITAL_COMMUNITY): Payer: Self-pay | Admitting: *Deleted

## 2016-04-16 ENCOUNTER — Emergency Department (HOSPITAL_COMMUNITY)
Admission: EM | Admit: 2016-04-16 | Discharge: 2016-04-16 | Disposition: A | Discharge status: Home / Self Care | Payer: BLUE CROSS/BLUE SHIELD | Attending: Emergency Medicine | Admitting: Emergency Medicine

## 2016-04-16 DIAGNOSIS — Z87891 Personal history of nicotine dependence: Secondary | ICD-10-CM | POA: Diagnosis not present

## 2016-04-16 DIAGNOSIS — J069 Acute upper respiratory infection, unspecified: Secondary | ICD-10-CM

## 2016-04-16 NOTE — ED Provider Notes (Signed)
[facility] DEPT Provider Note   CSN: [id number] Arrival date & time: 04/16/16  0802     History   Chief Complaint Chief Complaint  Patient presents with  . URI    HPI Traci Caldwell is a 50 y.o. female.  The history is provided by the patient. No language interpreter was used.  URI   This is a new problem. The problem has not changed since onset.There has been no fever. Associated symptoms include congestion and headaches. Pertinent negatives include no chest pain. She has tried nothing for the symptoms. The treatment provided no relief.    Past Medical History:  Diagnosis Date  . 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   Past Surgical History:  Procedure Laterality Date  . COLPOSCOPY  08/2004   CIN I  . TUBAL LIGATION  1996  . VAGINAL HYSTERECTOMY  12/2007   TVH- due to fibroids & bleeding    OB History    Gravida Para Term Preterm AB Living   2 2 2     2    SAB TAB Ectopic Multiple Live Births                   Home Medications    Prior to Admission medications   Medication Sig Start Date End Date Taking? Authorizing Provider  Ibuprofen (ADVIL PO) Take by mouth as needed.    Historical Provider, MD  Vitamin D, Ergocalciferol, (DRISDOL) 50000 UNITS CAPS capsule Take 1 capsule (50,000 Units total) by mouth every 7 (seven) days. 04/30/15   Megan Salon, MD    Family History Family History  Problem Relation Age of Onset  . Hypertension Mother   . Hypertension Father   . Endometriosis Sister     Social History Social History  Substance Use Topics  . Smoking status: Former Research scientist (life sciences)  . Smokeless tobacco: Never Used  . Alcohol use No     Allergies   Penicillins   Review of Systems Review of Systems  HENT: Positive for congestion.   Cardiovascular: Negative for chest pain.  Neurological: Positive for headaches.  All other systems reviewed  and are negative.    Physical Exam Updated Vital Signs BP 145/97 (BP Location: Left Arm)   Pulse 73   Temp 98.2 F (36.8 C) (Oral)   Resp 18   Ht 5' 3.5" (1.613 m)   Wt 81.6 kg   LMP 12/13/2007   SpO2 100%   BMI 31.39 kg/m   Physical Exam  Constitutional: She appears well-developed and well-nourished. No distress.  HENT:  Head: Normocephalic and atraumatic.  Right Ear: External ear normal.  Left Ear: External ear normal.  Nose: Nose normal.  Mouth/Throat: Oropharynx is clear and moist.  Eyes: Conjunctivae are normal.  Neck: Neck supple.  Cardiovascular: Normal rate and regular rhythm.   No murmur heard. Pulmonary/Chest: Effort normal and breath sounds normal. No respiratory distress.  Abdominal: Soft. There is no tenderness.  Musculoskeletal: She exhibits no edema.  Neurological: She is alert.  Skin: Skin is warm and dry.  Psychiatric: She has a normal mood and affect.  Nursing note and vitals reviewed.    ED Treatments / Results  Labs (all labs ordered are listed, but only abnormal results are displayed) Labs Reviewed - No data to display  EKG  EKG Interpretation None       Radiology No results found.  Procedures  Procedures (including critical care time)  Medications Ordered in ED Medications - No data to display   Initial Impression / Assessment and Plan / ED Course  I have reviewed the triage vital signs and the nursing notes.  Pertinent labs & imaging results that were available during my care of the patient were reviewed by me and considered in my medical decision making (see chart for details).  Clinical Course    I suspect viral illness,  I doubt sinus infection or pneumonia.    Final Clinical Impressions(s) / ED Diagnoses   Final diagnoses:  Upper respiratory tract infection, unspecified type   Pt advised symptomatic care,  Return if symptoms worsen or change An After Visit Summary was printed and given to the patient. New  Prescriptions Discharge Medication List as of 04/16/2016  8:27 AM       Fransico Meadow, PA-C 04/16/16 [place], MD 04/16/16 1258

## 2016-04-16 NOTE — ED Triage Notes (Signed)
Reports onset yesterday of non productive cough, sore throat and head congestion. Denies fever. No acute distress noted at triage.

## 2016-07-19 ENCOUNTER — Other Ambulatory Visit: Payer: Self-pay | Admitting: Obstetrics & Gynecology

## 2016-07-21 NOTE — Telephone Encounter (Signed)
Medication refill request: Vitamin D Last AEX:  04/27/15 SM Next AEX: 08/19/16 SM Last MMG (if hormonal medication request): 04/16/15 [facility] Refill authorized: 04/30/15 #30 1R. Please advise. Thank you.

## 2016-08-18 NOTE — Progress Notes (Deleted)
51 y.o. [id number] MarriedCaucasianF here for annual exam.    Patient's last menstrual period was 12/13/2007.          Sexually active: {yes no:314532}  The current method of family planning is status post hysterectomy.    Exercising: {yes LI:[id number]  {types:19826} Smoker:  Former smoker  Health Maintenance: Pap:  05/09 normal  History of abnormal Pap:  yes MMG:  04/16/15  Colonoscopy:  *** BMD:   *** TDaP:  *** Pneumonia vaccine(s):  *** Zostavax:   *** Hep C testing: *** Screening Labs: ***, Hb today: ***, Urine today: ***   reports that she has quit smoking. She has never used smokeless tobacco. She reports that she does not drink alcohol or use drugs.  Past Medical History:  Diagnosis Date  . 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 COLPOSCOPY  08/2004   CIN I  . TUBAL LIGATION  1996  . VAGINAL HYSTERECTOMY  12/2007   TVH- due to fibroids & bleeding    Current Outpatient Prescriptions  Medication Sig Dispense Refill  . Ibuprofen (ADVIL PO) Take by mouth as needed.    . Vitamin D, Ergocalciferol, (DRISDOL) 50000 units CAPS capsule TAKE 1 CAPSULE BY MOUTH EVERY 7 DAYS. 12 capsule 0   No current facility-administered medications for this visit.     Family History  Problem Relation Age of Onset  . Hypertension Mother   . Hypertension Father   . Endometriosis Sister     ROS:  Pertinent items are noted in HPI.  Otherwise, a comprehensive ROS was negative.  Exam:   LMP 12/13/2007   Weight change: @WEIGHTCHANGE @ Height:      Ht Readings from Last 3 Encounters:  04/16/16 5' 3.5" (1.613 m)  04/27/15 5' 3.75" (1.619 m)  03/21/14 5\' 4"  (1.626 m)    General appearance: alert, cooperative and appears stated age Head: Normocephalic, without obvious abnormality, atraumatic Neck: no adenopathy, supple, symmetrical, trachea midline and thyroid {EXAM; THYROID:18604} Lungs: clear  to auscultation bilaterally Breasts: {Exam; breast:13139::"normal appearance, no masses or tenderness"} Heart: regular rate and rhythm Abdomen: soft, non-tender; bowel sounds normal; no masses,  no organomegaly Extremities: extremities normal, atraumatic, no cyanosis or edema Skin: Skin color, texture, turgor normal. No rashes or lesions Lymph nodes: Cervical, supraclavicular, and axillary nodes normal. No abnormal inguinal nodes palpated Neurologic: Grossly normal   Pelvic: External genitalia:  no lesions              Urethra:  normal appearing urethra with no masses, tenderness or lesions              Bartholins and Skenes: normal                 Vagina: normal appearing vagina with normal color and discharge, no lesions              Cervix: {exam; cervix:14595}              Pap taken: {yes no:314532} Bimanual Exam:  Uterus:  {exam; uterus:12215}              Adnexa: {exam; adnexa:12223}               Rectovaginal: Confirms               Anus:  normal sphincter tone, no lesions  Chaperone was present for exam.  A:  Well Woman with normal exam  P:   {plan; gyn:5269::"mammogram","pap smear","return annually or prn"}

## 2016-08-19 ENCOUNTER — Other Ambulatory Visit: Payer: Self-pay | Admitting: Family Medicine

## 2016-08-19 ENCOUNTER — Ambulatory Visit: Payer: BLUE CROSS/BLUE SHIELD | Admitting: Obstetrics & Gynecology

## 2016-08-19 DIAGNOSIS — Z1231 Encounter for screening mammogram for malignant neoplasm of breast: Secondary | ICD-10-CM

## 2016-09-01 ENCOUNTER — Ambulatory Visit (INDEPENDENT_AMBULATORY_CARE_PROVIDER_SITE_OTHER): Payer: BLUE CROSS/BLUE SHIELD | Admitting: Obstetrics & Gynecology

## 2016-09-01 ENCOUNTER — Ambulatory Visit
Admission: RE | Admit: 2016-09-01 | Discharge: 2016-09-01 | Disposition: A | Discharge status: Home / Self Care | Payer: BLUE CROSS/BLUE SHIELD | Source: Ambulatory Visit | Attending: Family Medicine | Admitting: Family Medicine

## 2016-09-01 ENCOUNTER — Encounter: Payer: Self-pay | Admitting: Obstetrics & Gynecology

## 2016-09-01 VITALS — BP 128/86 | HR 58 | Resp 14 | Ht 64.0 in | Wt 183.0 lb

## 2016-09-01 DIAGNOSIS — Z124 Encounter for screening for malignant neoplasm of cervix: Secondary | ICD-10-CM | POA: Diagnosis not present

## 2016-09-01 DIAGNOSIS — Z0000 Encounter for general adult medical examination without abnormal findings: Secondary | ICD-10-CM

## 2016-09-01 DIAGNOSIS — R922 Inconclusive mammogram: Secondary | ICD-10-CM | POA: Diagnosis not present

## 2016-09-01 DIAGNOSIS — R030 Elevated blood-pressure reading, without diagnosis of hypertension: Secondary | ICD-10-CM | POA: Diagnosis not present

## 2016-09-01 DIAGNOSIS — Z01419 Encounter for gynecological examination (general) (routine) without abnormal findings: Secondary | ICD-10-CM

## 2016-09-01 DIAGNOSIS — E559 Vitamin D deficiency, unspecified: Secondary | ICD-10-CM

## 2016-09-01 DIAGNOSIS — Z1231 Encounter for screening mammogram for malignant neoplasm of breast: Secondary | ICD-10-CM

## 2016-09-01 DIAGNOSIS — Z1211 Encounter for screening for malignant neoplasm of colon: Secondary | ICD-10-CM

## 2016-09-01 DIAGNOSIS — Z87891 Personal history of nicotine dependence: Secondary | ICD-10-CM | POA: Insufficient documentation

## 2016-09-01 NOTE — Progress Notes (Signed)
51 y.o. [id number] Married Caucasian F here for annual exam.  Doing well.  Had emergency room visit in October.  No vaginal bleeding.    Patient's last menstrual period was 12/13/2007.          Sexually active: Yes.    The current method of family planning is status post hysterectomy.    Exercising: Yes.    walking Smoker:  Former smoker  Health Maintenance: Pap:  2009 normal  History of abnormal Pap:  yes MMG:  04/16/15 BIRADS 1 negative- [facility] Colonoscopy:  never BMD:   never TDaP:  2007- declined to update today  Pneumonia vaccine(s):  never Zostavax:   never Hep C testing: not indicated Screening Labs: drawn today, Hb today: same   reports that she has quit smoking. She has never used smokeless tobacco. She reports that she does not drink alcohol or use drugs.  Past Medical History:  Diagnosis Date  . 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 COLPOSCOPY  08/2004   CIN I  . TUBAL LIGATION  1996  . VAGINAL HYSTERECTOMY  12/2007   TVH- due to fibroids & bleeding    Current Outpatient Prescriptions  Medication Sig Dispense Refill  . Ibuprofen (ADVIL PO) Take by mouth as needed.     No current facility-administered medications for this visit.     Family History  Problem Relation Age of Onset  . Hypertension Mother   . Hypertension Father   . Endometriosis Sister     ROS:  Pertinent items are noted in HPI.  Otherwise, a comprehensive ROS was negative.  Exam:   BP 128/86 (BP Location: Right Arm, Patient Position: Sitting, Cuff Size: Normal)   Pulse (!) 58   Resp 14   Ht 5\' 4"  (1.626 m)   Wt 183 lb (83 kg)   LMP 12/13/2007   BMI 31.41 kg/m   Weight change: +2#  Height: 5\' 4"  (162.6 cm)  Ht Readings from Last 3 Encounters:  09/01/16 5\' 4"  (1.626 m)  04/16/16 5' 3.5" (1.613 m)  04/27/15 5' 3.75" (1.619 m)    General appearance: alert, cooperative  and appears stated age Head: Normocephalic, without obvious abnormality, atraumatic Neck: no adenopathy, supple, symmetrical, trachea midline and thyroid normal to inspection and palpation Lungs: clear to auscultation bilaterally Breasts: normal appearance, no masses or tenderness Heart: regular rate and rhythm Abdomen: soft, non-tender; bowel sounds normal; no masses,  no organomegaly Extremities: extremities normal, atraumatic, no cyanosis or edema Skin: Skin color, texture, turgor normal. No rashes or lesions Lymph nodes: Cervical, supraclavicular, and axillary nodes normal. No abnormal inguinal nodes palpated Neurologic: Grossly normal  Pelvic: External genitalia:  no lesions              Urethra:  normal appearing urethra with no masses, tenderness or lesions              Bartholins and Skenes: normal                 Vagina: normal appearing vagina with normal color and discharge, no lesions              Cervix: absent              Pap taken: Yes.   Bimanual Exam:  Uterus:  uterus absent              Adnexa:  no mass, fullness, tenderness               Rectovaginal: Confirms               Anus:  normal sphincter tone, no lesions  Chaperone was present for exam.  A:   Well woman exam H/O TVH, CIN 1 on biopsy in 2006, negative pathology with hysterectomy Grade D breast density Former smoker Mildly elevated cholesterol  P:   Mammogram done today.  D/W pt 3D MMG.  She will plan to do this next year Pap obtained today CBC, CMP, Lipids, Vit D Colonoscopy recommended.  Pt declines.  IFOB given. Tdap due.  Pt declines today but understand when update would be due. AEX 1 year or follow up prn

## 2016-09-02 LAB — COMPREHENSIVE METABOLIC PANEL
ALT: 9 U/L (ref 6–29)
AST: 11 U/L (ref 10–35)
Albumin: 4.3 g/dL (ref 3.6–5.1)
Alkaline Phosphatase: 62 U/L (ref 33–130)
BUN: 10 mg/dL (ref 7–25)
CHLORIDE: 104 mmol/L (ref 98–110)
CO2: 26 mmol/L (ref 20–31)
Calcium: 9.6 mg/dL (ref 8.6–10.4)
Creat: 0.65 mg/dL (ref 0.50–1.05)
GLUCOSE: 81 mg/dL (ref 65–99)
POTASSIUM: 4.3 mmol/L (ref 3.5–5.3)
Sodium: 137 mmol/L (ref 135–146)
Total Bilirubin: 0.5 mg/dL (ref 0.2–1.2)
Total Protein: 7.1 g/dL (ref 6.1–8.1)

## 2016-09-02 LAB — CBC
HEMATOCRIT: 43.9 % (ref 35.0–45.0)
HEMOGLOBIN: 14.3 g/dL (ref 11.7–15.5)
MCH: 29.4 pg (ref 27.0–33.0)
MCHC: 32.6 g/dL (ref 32.0–36.0)
MCV: 90.3 fL (ref 80.0–100.0)
MPV: 9.6 fL (ref 7.5–12.5)
Platelets: 330 10*3/uL (ref 140–400)
RBC: 4.86 MIL/uL (ref 3.80–5.10)
RDW: 13.3 % (ref 11.0–15.0)
WBC: 8.4 10*3/uL (ref 3.8–10.8)

## 2016-09-02 LAB — LIPID PANEL
Cholesterol: 224 mg/dL — ABNORMAL HIGH (ref <200)
HDL: 78 mg/dL (ref >50)
LDL Cholesterol: 130 mg/dL — ABNORMAL HIGH (ref <100)
Total CHOL/HDL Ratio: 2.9 Ratio (ref <5.0)
Triglycerides: 81 mg/dL (ref <150)
VLDL: 16 mg/dL (ref <30)

## 2016-09-02 LAB — VITAMIN D 25 HYDROXY (VIT D DEFICIENCY, FRACTURES): VIT D 25 HYDROXY: 15 ng/mL — AB (ref 30–100)

## 2016-09-03 LAB — IPS PAP TEST WITH REFLEX TO HPV

## 2016-09-04 NOTE — Addendum Note (Signed)
Addended by: Megan Salon on: 09/04/2016 10:59 PM   Modules accepted: Orders

## 2016-09-08 ENCOUNTER — Telehealth: Payer: Self-pay | Admitting: *Deleted

## 2016-09-08 MED ORDER — VITAMIN D (ERGOCALCIFEROL) 1.25 MG (50000 UNIT) PO CAPS: 50000.0000 [IU] | ORAL_CAPSULE | ORAL | 0 refills | Status: DC

## 2016-09-08 NOTE — Telephone Encounter (Signed)
-----   Message from Megan Salon, MD sent at 09/04/2016 10:59 PM EST ----- 02 recall.  Please let pt know her pap was normal.  However, yeast was present.  If she is having symptoms, ok to treat with diflucan 150mg  po x 1, repeat 72 hrs.  #2/0RF.    Also, let her know CBC and CMP was normal.  Lipid panel was 224 and LDLs 130.  HDLs 78 and triglycerides were normal.  Will keep following this as it is mildly elevated.  Vit D was low at 15.  She should take Vit D 50k weekly for 12 weeks and then have retested.  Ok to send in rx.  Order for Vit D recheck placed.

## 2016-09-08 NOTE — Telephone Encounter (Signed)
Spoke with patient, advised of results and recommendations as seen below. Patient denies any symptoms of yeast at this time, reviewed symptoms. Patient states she is currently driving, will return call for scheduling lab appointment for Vit D. Verified pharmacy on file, RX sent for Vit D. Patient verbalizes understanding and is agreeable.  Routing to provider for final review. Patient is agreeable to disposition. Will close encounter.

## 2016-09-08 NOTE — Telephone Encounter (Signed)
Message left to return call to Sondi Desch at [phone].    

## 2016-09-08 NOTE — Telephone Encounter (Signed)
Patient is returning a call to Emily. °

## 2016-09-08 NOTE — Telephone Encounter (Signed)
Message left to return call to Devynn Scheff at [phone].    

## 2016-09-09 ENCOUNTER — Other Ambulatory Visit: Payer: Self-pay | Admitting: Obstetrics & Gynecology

## 2016-09-09 NOTE — Telephone Encounter (Signed)
Patient checked her pharmacy and prescription for Vitamin D has not been sent.

## 2016-09-09 NOTE — Telephone Encounter (Signed)
Spoke with pharmacy and they received the prescription request yesterday and have filled it. Called patient back and advised her that she can pick the prescription up.

## 2016-09-17 LAB — FECAL OCCULT BLOOD, IMMUNOCHEMICAL: IMMUNOLOGICAL FECAL OCCULT BLOOD TEST: NEGATIVE

## 2016-09-17 NOTE — Addendum Note (Signed)
Addended by: Terence Lux A on: 09/17/2016 08:39 AM   Modules accepted: Orders

## 2016-10-20 ENCOUNTER — Telehealth: Payer: Self-pay | Admitting: Obstetrics & Gynecology

## 2016-10-20 ENCOUNTER — Other Ambulatory Visit: Payer: Self-pay | Admitting: Obstetrics & Gynecology

## 2016-10-20 MED ORDER — FLUCONAZOLE 150 MG PO TABS: 150.0000 mg | ORAL_TABLET | Freq: Once | ORAL | 0 refills | Status: AC

## 2016-10-20 NOTE — Telephone Encounter (Signed)
Left message to call Jhanvi Drakeford at [phone].  

## 2016-10-20 NOTE — Telephone Encounter (Signed)
Spoke with patient, advised as seen below per Dr. Miller. Patient verbalizes understanding and is agreeable.  Routing to provider for final review. Patient is agreeable to disposition. Will close encounter.  

## 2016-10-20 NOTE — Telephone Encounter (Signed)
Yes, this is fine.  Rx for diflucan 150mg  po x 1, repeat 72 hours sent to pharmacy.  #2/0RF.

## 2016-10-20 NOTE — Telephone Encounter (Signed)
Patient wants to have something called into the pharmacy for a yeast infection. She states she has already came in and was called and told she has a yeast infection. She is using [facility].

## 2016-10-20 NOTE — Telephone Encounter (Signed)
Spoke with patient. Patient states she was called with lab results in February and told that she had yeast. Patient states at that time she had no symptoms. Patient states she started experiencing vaginal itching last week and would like medication called in for yeast. Patient states she will use OTC monistat if Dr. Sabra Heck does not call in RX. Patient denies vaginal discharge, pain, odor or urinary complaints. Advised patient that result was from 09/01/16, would have to review with Dr. Sabra Heck for recommendations and return call. Patient verbalizes understanding and is agreeable. Patient states to leave voice mail if no answer.   Dr. Sabra Heck -please advise on diflucan? Pap from 2/19 positive for yeast, no symptoms.

## 2017-01-06 ENCOUNTER — Telehealth: Payer: Self-pay | Admitting: Obstetrics & Gynecology

## 2017-01-06 NOTE — Telephone Encounter (Signed)
Return call to patient. Reports history of hemorrhoids and today had bright red rectal bleeding. Denies pain. Has never had colonoscopy. Unsure if office visit needed. Advised cannot evaluate without office visit for assessment. Appointment scheduled for 01-08-17 with Dr Sabra Heck.  Routing to provider for final review. Patient agreeable to disposition. Will close encounter.   '

## 2017-01-06 NOTE — Telephone Encounter (Signed)
Patient has hemorrhoids and wants to come in to see Dr. Sabra Heck.

## 2017-01-08 ENCOUNTER — Ambulatory Visit (INDEPENDENT_AMBULATORY_CARE_PROVIDER_SITE_OTHER): Payer: BLUE CROSS/BLUE SHIELD | Admitting: Obstetrics & Gynecology

## 2017-01-08 VITALS — BP 136/90 | HR 88 | Resp 16 | Ht 64.0 in | Wt 188.0 lb

## 2017-01-08 DIAGNOSIS — K625 Hemorrhage of anus and rectum: Secondary | ICD-10-CM | POA: Diagnosis not present

## 2017-01-08 MED ORDER — HYDROCORTISONE 2.5 % RE CREA: 1.0000 "application " | TOPICAL_CREAM | Freq: Two times a day (BID) | RECTAL | 1 refills | Status: DC

## 2017-01-08 NOTE — Progress Notes (Signed)
GYNECOLOGY  VISIT   HPI: 51 y.o. G64P2002 Married Caucasian female here for complaint of rectal bleeding that started about a week ago with a bowel movement.  She reports she felt a little "sting" and then noted bright red rectal bleeding.  This stopped but then occurred again on Tuesday about three days later.   She has intermittent issues with constipation and diarrhea.  Is going through period of time where constipation is a bigger issue for her.  Doesn't want to take anything as she is concerned she will have issues with diarrhea.    Is due colononscopy.  Has declined due to cost.  However, has met max out of pocket this year due to husband's recent surgery.  D/W pt I would HIGHLY recommend she go ahead and have this done.  GYNECOLOGIC HISTORY: Patient's last menstrual period was 12/13/2007. Contraception: hysterectomy Menopausal hormone therapy: none  Patient Active Problem List   Diagnosis Date Noted  . White coat syndrome without diagnosis of hypertension 09/01/2016  . Breast density 09/01/2016  . Former smoker 09/01/2016    Past Medical History:  Diagnosis Date  . 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 COLPOSCOPY  08/2004   CIN I  . TUBAL LIGATION  1996  . VAGINAL HYSTERECTOMY  12/2007   TVH- due to fibroids & bleeding    MEDS:  Reviewed in EPIC and UTD  ALLERGIES: Penicillins  Family History  Problem Relation Age of Onset  . Hypertension Mother   . Hypertension Father   . Endometriosis Sister     SH:  Married, former smoker  Review of Systems  Constitutional: Negative.   Gastrointestinal:       Rectal bleeding  Genitourinary: Negative.     PHYSICAL EXAMINATION:    BP 136/90 (BP Location: Left Arm, Patient Position: Sitting, Cuff Size: Large)   Pulse 88   Resp 16   Ht '5\' 4"'  (1.626 m)   Wt 188 lb (85.3 kg)   LMP 12/13/2007   BMI 32.27 kg/m     General  appearance: alert, cooperative and appears stated age  Pelvic: External genitalia:  no lesions              Urethra:  normal appearing urethra with no masses, tenderness or lesions              Bartholins and Skenes: normal                 Vagina: normal appearing vagina with normal color and discharge, no lesions              Cervix: absent              Rectovaginal: external hemorrhoids noted but none are tender, thrombosed, or have evidence of bleeding              Anus:  normal sphincter tone, no lesions, no clear internal hemorrhoids are noted  Guiac negative today  Chaperone was present for exam.  Assessment: Rectal bleeding with external hemorrhoids but no clear cause of bleeding noted  Plan: High recommend she proceed with colonoscopy screening.  Pt states she will consider.  She is ok with me making referral but states she may cancel it.   Hydrocortisone 2.5% to be used BID internally and externally for next 7 days to see if this will resolve bleeding.

## 2017-01-08 NOTE — Patient Instructions (Signed)
miralax daily

## 2017-01-10 ENCOUNTER — Encounter: Payer: Self-pay | Admitting: Obstetrics & Gynecology

## 2017-01-12 ENCOUNTER — Telehealth: Payer: Self-pay | Admitting: Obstetrics & Gynecology

## 2017-01-12 NOTE — Telephone Encounter (Signed)
Left voicemail regarding referral appointment. The information is listed below. Should the patient need to cancel or reschedule this appointment, please advise them to call the office they've been referred to in order to reschedule.  [facility]  Phone: [phone]  Dr Collene Mares 02-12-17 @ 9am. Please arrive 15 minutes early and bring your insurance card, photo id and list of medications.

## 2017-01-19 NOTE — Telephone Encounter (Signed)
Patient is returning a call to [place]. I gave patient the referral information from [facility] note. No need to return call.

## 2017-01-21 NOTE — Telephone Encounter (Signed)
Noted. Routing to provider for review. Will close encounter.

## 2017-08-18 ENCOUNTER — Ambulatory Visit: Payer: BLUE CROSS/BLUE SHIELD | Admitting: Obstetrics & Gynecology

## 2017-08-26 DIAGNOSIS — N390 Urinary tract infection, site not specified: Secondary | ICD-10-CM | POA: Diagnosis not present

## 2017-09-23 ENCOUNTER — Other Ambulatory Visit: Payer: Self-pay | Admitting: Family Medicine

## 2017-09-23 DIAGNOSIS — Z1231 Encounter for screening mammogram for malignant neoplasm of breast: Secondary | ICD-10-CM

## 2017-09-25 ENCOUNTER — Ambulatory Visit
Admission: RE | Admit: 2017-09-25 | Discharge: 2017-09-25 | Disposition: A | Discharge status: Home / Self Care | Payer: BLUE CROSS/BLUE SHIELD | Source: Ambulatory Visit | Attending: Family Medicine | Admitting: Family Medicine

## 2017-09-25 DIAGNOSIS — Z1231 Encounter for screening mammogram for malignant neoplasm of breast: Secondary | ICD-10-CM

## 2017-12-01 ENCOUNTER — Ambulatory Visit: Payer: BLUE CROSS/BLUE SHIELD | Admitting: Obstetrics & Gynecology

## 2017-12-01 ENCOUNTER — Encounter

## 2018-04-08 LAB — LIPID PANEL
Cholesterol: 241 — AB (ref 0–200)
HDL: 66 (ref 35–70)
LDL Cholesterol: 154
Triglycerides: 103 (ref 40–160)

## 2018-04-08 LAB — BASIC METABOLIC PANEL: Glucose: 87

## 2018-05-11 ENCOUNTER — Other Ambulatory Visit: Payer: Self-pay

## 2018-05-11 ENCOUNTER — Encounter: Payer: Self-pay | Admitting: Obstetrics & Gynecology

## 2018-05-11 ENCOUNTER — Ambulatory Visit (INDEPENDENT_AMBULATORY_CARE_PROVIDER_SITE_OTHER): Payer: BLUE CROSS/BLUE SHIELD | Admitting: Obstetrics & Gynecology

## 2018-05-11 VITALS — BP 140/90 | HR 68 | Resp 16 | Ht 63.5 in | Wt 190.4 lb

## 2018-05-11 DIAGNOSIS — Z1211 Encounter for screening for malignant neoplasm of colon: Secondary | ICD-10-CM | POA: Diagnosis not present

## 2018-05-11 DIAGNOSIS — Z01419 Encounter for gynecological examination (general) (routine) without abnormal findings: Secondary | ICD-10-CM

## 2018-05-11 DIAGNOSIS — M2740 Unspecified cyst of jaw: Secondary | ICD-10-CM | POA: Diagnosis not present

## 2018-05-11 DIAGNOSIS — Z0000 Encounter for general adult medical examination without abnormal findings: Secondary | ICD-10-CM

## 2018-05-11 DIAGNOSIS — E559 Vitamin D deficiency, unspecified: Secondary | ICD-10-CM | POA: Diagnosis not present

## 2018-05-11 NOTE — Progress Notes (Signed)
52 y.o. G40P2002 Married White or Caucasian female here for annual exam.  Doing well.  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  He is going to not re-up his time.  He will be in the [employer] for three more years.  He is in the [employer].  Denies vaginal bleeding.  Has not had any rectal bleeding since prior visit.    Has a cystic feeling lesion on left lower edge of jaw.  Reports this has been present for several years.  This is mobile and non tender.  Feels it occurred when she stopped smoking.  Cannot feel it on the inside of her mouth.    Patient's last menstrual period was 12/13/2007.          Sexually active: Yes.    The current method of family planning is status post hysterectomy.    Exercising: Yes.    walking at work  Smoker:  no  Health Maintenance: Pap:  09/01/16 Neg History of abnormal Pap:  Yes, CIN I  MMG:  09/25/17 BIRADS1:Neg Colonoscopy:  Never BMD:   Never TDaP:  2007.  Aware this is due.  Declines today. Pneumonia vaccine(s):  n/a Shingrix:   No Hep C testing: n/a Screening Labs: Here today    reports that she has quit smoking. She has never used smokeless tobacco. She reports that she does not drink alcohol or use drugs.  Past Medical History:  Diagnosis Date  . 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 COLPOSCOPY  08/2004   CIN I  . TUBAL LIGATION  1996  . VAGINAL HYSTERECTOMY  12/2007   TVH- due to fibroids & bleeding    Current Outpatient Medications  Medication Sig Dispense Refill  . cholecalciferol (VITAMIN D) 1000 units tablet Take 1,000 Units by mouth daily.    . hydrocortisone (ANUSOL-HC) 2.5 % rectal cream Place 1 application rectally 2 (two) times daily. 30 g 1  . Ibuprofen (ADVIL PO) Take by mouth as needed.     No current facility-administered medications for this visit.     Family History  Problem Relation Age of Onset  . Hypertension Mother   . Hypertension Father    . Endometriosis Sister     Review of Systems  All other systems reviewed and are negative.   Exam:   BP 140/90 (BP Location: Right Arm, Patient Position: Sitting, Cuff Size: Large)   Pulse 68   Resp 16   Ht 5' 3.5" (1.613 m)   Wt 190 lb 6.4 oz (86.4 kg)   LMP 12/13/2007   BMI 33.20 kg/m   Height: 5' 3.5" (161.3 cm)  Ht Readings from Last 3 Encounters:  05/11/18 5' 3.5" (1.613 m)  01/08/17 5\' 4"  (1.626 m)  09/01/16 5\' 4"  (1.626 m)    General appearance: alert, cooperative and appears stated age Head: Normocephalic, without obvious abnormality, atraumatic Neck: no adenopathy, supple, symmetrical, trachea midline and thyroid normal to inspection and palpation, 2.5cm cystic feeling lesion along left jaw, mobile and non tender Lungs: clear to auscultation bilaterally Breasts: normal appearance, no masses or tenderness Heart: regular rate and rhythm Abdomen: soft, non-tender; bowel sounds normal; no masses,  no organomegaly Extremities: extremities normal, atraumatic, no cyanosis or edema Skin: Skin color, texture, turgor normal. No rashes or lesions Lymph nodes: Cervical, supraclavicular, and axillary nodes normal. No abnormal inguinal nodes palpated Neurologic: Grossly normal   Pelvic: External genitalia:  no lesions  Urethra:  normal appearing urethra with no masses, tenderness or lesions              Bartholins and Skenes: normal                 Vagina: normal appearing vagina with normal color and discharge, no lesions              Cervix: absent              Pap taken: No. Bimanual Exam:  Uterus:  uterus absent              Adnexa: no mass, fullness, tenderness               Rectovaginal: Confirms               Anus:  normal sphincter tone, no lesions  Chaperone was present for exam.  A:  Well Woman with normal exam PMP female H/o smoking, quit >5 years ago Cystic lesion along lower left jaw, mobile and smooth   P:   Mammogram guidelines  reviewed pap smear not indicated Declines colonoscopy and cologuard due to cost.  IFOB given. Blood work obtained today--CBC, CMP, lipids, TSH, and Vit D Tdap due.  Declines.  Aware of importance of vaccination if has exposure Not interested in Shingrix vaccination Referral to ENT placed today Return annually or prn

## 2018-05-12 LAB — CBC
HEMOGLOBIN: 14.4 g/dL (ref 11.1–15.9)
Hematocrit: 43.7 % (ref 34.0–46.6)
MCH: 28.9 pg (ref 26.6–33.0)
MCHC: 33 g/dL (ref 31.5–35.7)
MCV: 88 fL (ref 79–97)
PLATELETS: 330 10*3/uL (ref 150–450)
RBC: 4.99 x10E6/uL (ref 3.77–5.28)
RDW: 12.2 % — ABNORMAL LOW (ref 12.3–15.4)
WBC: 6 10*3/uL (ref 3.4–10.8)

## 2018-05-12 LAB — COMPREHENSIVE METABOLIC PANEL
ALBUMIN: 4.4 g/dL (ref 3.5–5.5)
ALK PHOS: 64 IU/L (ref 39–117)
ALT: 13 IU/L (ref 0–32)
AST: 14 IU/L (ref 0–40)
Albumin/Globulin Ratio: 1.7 (ref 1.2–2.2)
BILIRUBIN TOTAL: 0.5 mg/dL (ref 0.0–1.2)
BUN / CREAT RATIO: 20 (ref 9–23)
BUN: 13 mg/dL (ref 6–24)
CHLORIDE: 105 mmol/L (ref 96–106)
CO2: 21 mmol/L (ref 20–29)
CREATININE: 0.65 mg/dL (ref 0.57–1.00)
Calcium: 9.4 mg/dL (ref 8.7–10.2)
GFR calc Af Amer: 118 mL/min/{1.73_m2} (ref >59)
GFR calc non Af Amer: 102 mL/min/{1.73_m2} (ref >59)
GLUCOSE: 79 mg/dL (ref 65–99)
Globulin, Total: 2.6 g/dL (ref 1.5–4.5)
POTASSIUM: 4.2 mmol/L (ref 3.5–5.2)
SODIUM: 139 mmol/L (ref 134–144)
Total Protein: 7 g/dL (ref 6.0–8.5)

## 2018-05-12 LAB — LIPID PANEL
CHOLESTEROL TOTAL: 220 mg/dL — AB (ref 100–199)
Chol/HDL Ratio: 3.6 ratio (ref 0.0–4.4)
HDL: 61 mg/dL (ref >39)
LDL CALC: 135 mg/dL — AB (ref 0–99)
Triglycerides: 118 mg/dL (ref 0–149)
VLDL Cholesterol Cal: 24 mg/dL (ref 5–40)

## 2018-05-12 LAB — VITAMIN D 25 HYDROXY (VIT D DEFICIENCY, FRACTURES): VIT D 25 HYDROXY: 26.9 ng/mL — AB (ref 30.0–100.0)

## 2018-05-12 LAB — TSH: TSH: 1.75 u[IU]/mL (ref 0.450–4.500)

## 2018-05-17 ENCOUNTER — Telehealth: Payer: Self-pay | Admitting: *Deleted

## 2018-05-17 NOTE — Telephone Encounter (Signed)
Pt notified of results. Verbalized understanding  Patient requesting status of referral placed 05/11/18.  Routed to [facility]

## 2018-05-17 NOTE — Telephone Encounter (Signed)
-----   Message from Megan Salon, MD sent at 05/14/2018  2:51 PM EDT ----- Please let pt know her CMP and TSH were normal.  CBC looked good. Lipids were mildly elevated at 220 and her LDLs were 135.  This is ok to watch.  Lastly, Vit D was 27.  This is fine and she doesn't need to make any changes but she should continue to take the OTC Vit D.  Thanks.

## 2018-05-17 NOTE — Telephone Encounter (Signed)
Patient returning call to [place]. Patient stated that results can be left on voicemail.

## 2018-05-17 NOTE — Telephone Encounter (Signed)
LM for pt to call back.

## 2018-05-21 NOTE — Telephone Encounter (Signed)
"  Dr. Sabra Heck,   This is to notify you that your patient, Traci Caldwell , is scheduled with Dr. Ilda Foil  on 05/27/18.  Thank you,  [name]"

## 2018-05-27 DIAGNOSIS — R221 Localized swelling, mass and lump, neck: Secondary | ICD-10-CM | POA: Diagnosis not present

## 2018-05-27 DIAGNOSIS — D170 Benign lipomatous neoplasm of skin and subcutaneous tissue of head, face and neck: Secondary | ICD-10-CM | POA: Diagnosis not present

## 2018-05-28 DIAGNOSIS — Z1211 Encounter for screening for malignant neoplasm of colon: Secondary | ICD-10-CM | POA: Diagnosis not present

## 2018-05-30 LAB — FECAL OCCULT BLOOD, IMMUNOCHEMICAL: Fecal Occult Bld: NEGATIVE

## 2018-05-30 LAB — SPECIMEN STATUS REPORT

## 2018-06-01 DIAGNOSIS — R221 Localized swelling, mass and lump, neck: Secondary | ICD-10-CM | POA: Diagnosis not present

## 2018-07-09 DIAGNOSIS — B349 Viral infection, unspecified: Secondary | ICD-10-CM | POA: Diagnosis not present

## 2018-07-09 DIAGNOSIS — R509 Fever, unspecified: Secondary | ICD-10-CM | POA: Diagnosis not present

## 2018-07-09 DIAGNOSIS — R110 Nausea: Secondary | ICD-10-CM | POA: Diagnosis not present

## 2018-10-26 ENCOUNTER — Other Ambulatory Visit: Payer: Self-pay | Admitting: Obstetrics & Gynecology

## 2018-10-26 ENCOUNTER — Other Ambulatory Visit: Payer: Self-pay | Admitting: Family Medicine

## 2018-10-26 DIAGNOSIS — Z1231 Encounter for screening mammogram for malignant neoplasm of breast: Secondary | ICD-10-CM

## 2018-11-16 DIAGNOSIS — R079 Chest pain, unspecified: Secondary | ICD-10-CM | POA: Diagnosis not present

## 2018-11-16 DIAGNOSIS — I10 Essential (primary) hypertension: Secondary | ICD-10-CM | POA: Diagnosis not present

## 2018-11-16 DIAGNOSIS — R072 Precordial pain: Secondary | ICD-10-CM | POA: Diagnosis not present

## 2018-11-16 LAB — CBC AND DIFFERENTIAL
HCT: 41 (ref 36–46)
Hemoglobin: 13.8 (ref 12.0–16.0)
Platelets: 306 (ref 150–399)
WBC: 7.6

## 2018-11-16 LAB — HEPATIC FUNCTION PANEL
ALT: 16 (ref 7–35)
AST: 26 (ref 13–35)
Alkaline Phosphatase: 76 (ref 25–125)
Bilirubin, Total: 0.5

## 2018-11-16 LAB — BASIC METABOLIC PANEL
BUN: 19 (ref 4–21)
Creatinine: 0.7 (ref <=1.1)
Glucose: 115
Potassium: 4.6 (ref 3.4–5.3)
Sodium: 136 — AB (ref 137–147)

## 2018-11-16 LAB — POCT INR: INR: 0.9 (ref <=1.1)

## 2018-11-17 ENCOUNTER — Telehealth: Payer: Self-pay | Admitting: Obstetrics & Gynecology

## 2018-11-17 NOTE — Telephone Encounter (Signed)
Patient needs to have a stress test done and would like your recommendation on a provider to see for this, if possible.

## 2018-11-19 NOTE — Telephone Encounter (Signed)
Call returned to patient, left detailed message, ok per dpr. Advised as seen below per Dr. Sabra Heck. Advised to return call to office to advise if she would like to proceed with referral.

## 2018-11-19 NOTE — Telephone Encounter (Signed)
Spoke with patient. Patient states she was seen at [facility] on 5/4 for chest discomfort, "enzyme test were normal, b/p was elevated, did come down before leaving ER". Patient states she was advised she may need a stress test, patient states no referral or recommendations provided. Denies any symptoms currently. Does not have a PCP, lives in [facility], does have a PCP in mind. Recommended patient call to schedule f/u and establish care with PCP for further evaluation. Advised patient there is a IT trainer in [place]. Advised I will forward to Dr. Sabra Heck to review, our office will return call with any additional recommendations. Patient aware Dr. Sabra Heck is not in the office, response may not be immediate.   Routing to Dr. Sabra Heck

## 2018-11-19 NOTE — Telephone Encounter (Signed)
Patient calling to check status of message she left on Wednesday.

## 2018-11-19 NOTE — Telephone Encounter (Signed)
Left message advising patient I have forwarded message to Dr. Sabra Heck for review, our office will f/u with recommendations. Advised response may not be immediate, Dr. Sabra Heck is out of the office.   Dr. Sabra Heck -please advise on recommendations for cardiologist for "stress test".

## 2018-11-19 NOTE — Telephone Encounter (Signed)
I do not know the providers in [place].  There is no female provider there.  I think seeing a female provider might be something she would prefer.  Dr. Dorris Carnes or Dr Skeet Latch.  I can make the referral if she wants.

## 2018-11-30 ENCOUNTER — Encounter: Payer: Self-pay | Admitting: Adult Health

## 2018-11-30 ENCOUNTER — Ambulatory Visit (INDEPENDENT_AMBULATORY_CARE_PROVIDER_SITE_OTHER): Payer: BLUE CROSS/BLUE SHIELD | Admitting: Adult Health

## 2018-11-30 ENCOUNTER — Other Ambulatory Visit: Payer: Self-pay

## 2018-11-30 VITALS — BP 139/88 | HR 57 | Temp 98.8°F | Ht 64.25 in | Wt 192.7 lb

## 2018-11-30 DIAGNOSIS — E7800 Pure hypercholesterolemia, unspecified: Secondary | ICD-10-CM | POA: Diagnosis not present

## 2018-11-30 DIAGNOSIS — R079 Chest pain, unspecified: Secondary | ICD-10-CM | POA: Diagnosis not present

## 2018-11-30 DIAGNOSIS — R030 Elevated blood-pressure reading, without diagnosis of hypertension: Secondary | ICD-10-CM | POA: Diagnosis not present

## 2018-11-30 DIAGNOSIS — Z0000 Encounter for general adult medical examination without abnormal findings: Secondary | ICD-10-CM | POA: Insufficient documentation

## 2018-11-30 DIAGNOSIS — E66811 Obesity, class 1: Secondary | ICD-10-CM

## 2018-11-30 DIAGNOSIS — E669 Obesity, unspecified: Secondary | ICD-10-CM

## 2018-11-30 DIAGNOSIS — S40022A Contusion of left upper arm, initial encounter: Secondary | ICD-10-CM

## 2018-11-30 NOTE — Progress Notes (Signed)
Subjective:    Patient ID: Traci Caldwell, female    DOB: [birth year], 53 y.o.   MRN: [id number]  HPI:  Traci Caldwell is here to establish as a new pt.  She is a pleasant 53 year old female. PMH: Obesity, Elevated BP reading without dx of HTN, Elevated LDL, recent ED visit for CP. She brought after visit summary from [facility] ED- Date of visit 11/16/2018 Pt's hand written notes on front of summary: BP 196/89, 146/79, 158/87 Troponin x 2- negative EKG- normal Xray- normal She reports that she participated in strenuous physical labor three days prior to ED visit (ie: overhead tree limb cutting, weed eating, and then coiled 1/2" thick air hose at work). She called EMS to home due concerns of chest tightness/pain that was radiating to her back. EMS completed EKG, per pt it was normal. Of Note- two normal EKGs in last two weeks. She denies any acute cardiac sx's currently. She quite tobacco use >8 years ago,denies any ETOH use She walks 20 mins twice weekly. 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  She denies striking head. She reports intermittent L sided thorax pain when lifting or bending. Current O2 sat on RA 97%   Patient Care Team    Relationship Specialty Notifications Start End  Mina Marble D, NP PCP - General Family Medicine  11/30/18   Megan Salon, MD Consulting Physician Gynecology  11/30/18     Patient Active Problem List   Diagnosis Date Noted  . Obesity (BMI 30.0-34.9) 11/30/2018  . Elevated LDL cholesterol level 11/30/2018  . Elevated blood pressure reading 11/30/2018  . Healthcare maintenance 11/30/2018  . Left-sided chest pain 11/30/2018  . White coat syndrome without  diagnosis of hypertension 09/01/2016  . Breast density 09/01/2016  . Former smoker 09/01/2016     Past Medical History:  Diagnosis Date  . CIN I (cervical intraepithelial neoplasia I) 08/2204   Conservative management     Past Surgical History:  Procedure Laterality Date  . COLPOSCOPY  08/2004   CIN I  . TUBAL LIGATION  1996  . VAGINAL HYSTERECTOMY  12/2007   TVH- due to fibroids & bleeding     Family History  Problem Relation Age of Onset  . Hypertension Mother   . Depression Mother   . Hypertension Father   . Hyperlipidemia Father   . Stroke Father   . Endometriosis Sister   . Diabetes Paternal Uncle   . Cancer Maternal Grandmother        stomach  . Hypertension Maternal Grandmother   . Hypertension Maternal Grandfather   . Diabetes Paternal Grandmother   . Hypertension Paternal Grandmother   . Hypertension Paternal Grandfather   . Stroke Paternal Grandfather      Social History   Substance and Sexual Activity  Drug Use No     Social History   Substance and Sexual Activity  Alcohol Use No     Social History   Tobacco Use  Smoking Status Former Smoker  . Packs/day: 1.00  . Years: 20.00  . Pack years: 20.00  . Types: Cigarettes  . Last attempt to quit: 07/14/2010  . Years since quitting: 8.3  Smokeless  Tobacco Never Used     Outpatient Encounter Medications as of 11/30/2018  Medication Sig  . aspirin EC 81 MG tablet Take 81 mg by mouth daily.  . Cholecalciferol (VITAMIN D3) 125 MCG (5000 UT) TABS Take 2 tablets by mouth. Take 2 tablets daily Monday thru Friday once per month  . Ibuprofen (ADVIL PO) Take by mouth as needed.  . [DISCONTINUED] cholecalciferol (VITAMIN D) 1000 units tablet Take 1,000 Units by mouth daily.  . [DISCONTINUED] hydrocortisone (ANUSOL-HC) 2.5 % rectal cream Place 1 application rectally 2 (two) times daily.   No facility-administered encounter medications on file as of 11/30/2018.     Allergies: Penicillins  Body  mass index is 32.82 kg/m.  Blood pressure 139/88, pulse (!) 57, temperature 98.8 F (37.1 C), temperature source Oral, height 5' 4.25" (1.632 m), weight 192 lb 11.2 oz (87.4 kg), last menstrual period 12/13/2007, SpO2 97 %.  Review of Systems  Constitutional: Positive for fatigue. Negative for activity change, appetite change, chills, diaphoresis and fever.  HENT: Negative for congestion and postnasal drip.   Eyes: Negative for visual disturbance.  Respiratory: Negative for cough, chest tightness, shortness of breath, wheezing and stridor.   Cardiovascular: Negative for chest pain, palpitations and leg swelling.  Gastrointestinal: Negative for abdominal distention, abdominal pain, anal bleeding, blood in stool, constipation, diarrhea, nausea, rectal pain and vomiting.  Endocrine: Negative for cold intolerance, heat intolerance, polydipsia, polyphagia and polyuria.  Genitourinary: Negative for difficulty urinating and flank pain.  Musculoskeletal: Positive for myalgias. Negative for arthralgias, back pain, gait problem, joint swelling, neck pain and neck stiffness.  Neurological: Negative for dizziness, light-headedness and headaches.  Hematological: Does not bruise/bleed easily.  Psychiatric/Behavioral: Negative for agitation, behavioral problems, confusion, decreased concentration, dysphoric mood, hallucinations, self-injury, sleep disturbance and suicidal ideas. The patient is not nervous/anxious and is not hyperactive.        Objective:   Physical Exam Vitals signs and nursing note reviewed.  Constitutional:      General: She is not in acute distress.    Appearance: Normal appearance. She is not ill-appearing, toxic-appearing or diaphoretic.  HENT:     Head: Normocephalic and atraumatic.  Eyes:     Conjunctiva/sclera: Conjunctivae normal.     Pupils: Pupils are equal, round, and reactive to light.  Cardiovascular:     Rate and Rhythm: Normal rate.     Pulses: Normal pulses.      Heart sounds: Normal heart sounds. No murmur. No friction rub. No gallop.   Pulmonary:     Effort: Pulmonary effort is normal. No respiratory distress.     Breath sounds: Normal breath sounds. No wheezing, rhonchi or rales.  Chest:     Chest wall: No tenderness.  Musculoskeletal:        General: Tenderness and signs of injury present.     Left shoulder: Normal.     Left elbow: Normal.     Right upper arm: Normal.     Left upper arm: She exhibits tenderness and swelling.     Comments: LUE- ecchymosis noted L FA, no open tissue or drainage noted L lateral chest- no ecchymosis or pinpoint tenderness noted No acute distress noted during respiration   Skin:    General: Skin is warm and dry.     Capillary Refill: Capillary refill takes less than 2 seconds.     Findings: Bruising present. No erythema.  Neurological:     Mental Status: She is alert and oriented to person, place, and time.  Psychiatric:        Mood and Affect: Mood normal.        Behavior: Behavior normal.        Thought Content: Thought content normal.        Judgment: Judgment normal.       Assessment & Plan:   1. Elevated blood pressure reading   2. Elevated LDL cholesterol level   3. Obesity (BMI 30.0-34.9)   4. Healthcare maintenance   5. Arm bruise, left, initial encounter   6. Left-sided chest pain   7. White coat syndrome without diagnosis of hypertension     Healthcare maintenance Please check you blood pressure and heart rate several times/week. If consistently >140/80, then please call clinic. Follow Mediterranean diet. Increase daily walking- strive for 46mins 5 times/week. Please schedule fasting lab appt in 1-2 weeks. Please schedule complete physical in 3 months. If Left Side rib/thorax pain worsens, please call clinic for chest xray. COVID-19 Education: Continue to follow the Stay at Home order, and when out- Social Distance and wearing a facial mask.  Obesity (BMI 30.0-34.9) Current wt  192 Body mass index is 32.82 kg/m. Mediterranean diet Increase daily walking- 13mins 5 times/week Weight loss will help reduce blood pressure and cholesterol levels  Left-sided chest pain Not cardiac in nature R/t to fall sustained 11/26/2018 No ecchymosis noted or pinpoint pain with palpation If sx's worsen, advised to call clinic for CXR  White coat syndrome without diagnosis of hypertension BP today 139/88, HR 57    FOLLOW-UP:  Return in about 3 months (around 03/02/2019) for CPE.

## 2018-11-30 NOTE — Assessment & Plan Note (Signed)
Please check you blood pressure and heart rate several times/week. If consistently >140/80, then please call clinic. Follow Mediterranean diet. Increase daily walking- strive for 36mins 5 times/week. Please schedule fasting lab appt in 1-2 weeks. Please schedule complete physical in 3 months. If Left Side rib/thorax pain worsens, please call clinic for chest xray. COVID-19 Education: Continue to follow the Stay at Home order, and when out- Social Distance and wearing a facial mask.

## 2018-11-30 NOTE — Assessment & Plan Note (Signed)
Not cardiac in nature R/t to fall sustained 11/26/2018 No ecchymosis noted or pinpoint pain with palpation If sx's worsen, advised to call clinic for CXR

## 2018-11-30 NOTE — Patient Instructions (Addendum)
Mediterranean Diet A Mediterranean diet refers to food and lifestyle choices that are based on the traditions of countries located on the [employer]. This way of eating has been shown to help prevent certain conditions and improve outcomes for people who have chronic diseases, like kidney disease and heart disease. What are tips for following this plan? Lifestyle  Cook and eat meals together with your family, when possible.  Drink enough fluid to keep your urine clear or pale yellow.  Be physically active every day. This includes: ? Aerobic exercise like running or swimming. ? Leisure activities like gardening, walking, or housework.  Get 7-8 hours of sleep each night.  If recommended by your health care provider, drink red wine in moderation. This means 1 glass a day for nonpregnant women and 2 glasses a day for men. A glass of wine equals 5 oz (150 mL). Reading food labels   Check the serving size of packaged foods. For foods such as rice and pasta, the serving size refers to the amount of cooked product, not dry.  Check the total fat in packaged foods. Avoid foods that have saturated fat or trans fats.  Check the ingredients list for added sugars, such as corn syrup. Shopping  At the grocery store, buy most of your food from the areas near the walls of the store. This includes: ? Fresh fruits and vegetables (produce). ? Grains, beans, nuts, and seeds. Some of these may be available in unpackaged forms or large amounts (in bulk). ? Fresh seafood. ? Poultry and eggs. ? Low-fat dairy products.  Buy whole ingredients instead of prepackaged foods.  Buy fresh fruits and vegetables in-season from local farmers markets.  Buy frozen fruits and vegetables in resealable bags.  If you do not have access to quality fresh seafood, buy precooked frozen shrimp or canned fish, such as tuna, salmon, or sardines.  Buy small amounts of raw or cooked vegetables, salads, or olives from  the deli or salad bar at your store.  Stock your pantry so you always have certain foods on hand, such as olive oil, canned tuna, canned tomatoes, rice, pasta, and beans. Cooking  Cook foods with extra-virgin olive oil instead of using butter or other vegetable oils.  Have meat as a side dish, and have vegetables or grains as your main dish. This means having meat in small portions or adding small amounts of meat to foods like pasta or stew.  Use beans or vegetables instead of meat in common dishes like chili or lasagna.  Experiment with different cooking methods. Try roasting or broiling vegetables instead of steaming or sauteing them.  Add frozen vegetables to soups, stews, pasta, or rice.  Add nuts or seeds for added healthy fat at each meal. You can add these to yogurt, salads, or vegetable dishes.  Marinate fish or vegetables using olive oil, lemon juice, garlic, and fresh herbs. Meal planning   Plan to eat 1 vegetarian meal one day each week. Try to work up to 2 vegetarian meals, if possible.  Eat seafood 2 or more times a week.  Have healthy snacks readily available, such as: ? Vegetable sticks with hummus. ? [place] yogurt. ? Fruit and nut trail mix.  Eat balanced meals throughout the week. This includes: ? Fruit: 2-3 servings a day ? Vegetables: 4-5 servings a day ? Low-fat dairy: 2 servings a day ? Fish, poultry, or lean meat: 1 serving a day ? Beans and legumes: 2 or more servings a week ?  Nuts and seeds: 1-2 servings a day ? Whole grains: 6-8 servings a day ? Extra-virgin olive oil: 3-4 servings a day  Limit red meat and sweets to only a few servings a month What are my food choices?  Mediterranean diet ? Recommended ? Grains: Whole-grain pasta. Brown rice. Bulgar wheat. Polenta. Couscous. Whole-wheat bread. [place]. ? Vegetables: Artichokes. Beets. Broccoli. Cabbage. Carrots. Eggplant. Green beans. Chard. Kale. Spinach. Onions. Leeks. Peas. Squash.  Tomatoes. Peppers. Radishes. ? Fruits: Apples. Apricots. Avocado. Berries. Bananas. Cherries. Dates. Figs. Grapes. Lemons. Melon. Oranges. Peaches. Plums. Pomegranate. ? Meats and other protein foods: Beans. Almonds. Sunflower seeds. Pine nuts. Peanuts. [place]. Salmon. Scallops. Shrimp. [place]. Tilapia. Clams. Oysters. Eggs. ? Dairy: Low-fat milk. Cheese. Greek yogurt. ? Beverages: Water. Red wine. Herbal tea. ? Fats and oils: Extra virgin olive oil. Avocado oil. Grape seed oil. ? Sweets and desserts: [place] yogurt with honey. Baked apples. Poached pears. Trail mix. ? Seasoning and other foods: Basil. Cilantro. Coriander. Cumin. Mint. Parsley. Sage. Rosemary. Tarragon. Garlic. Oregano. Thyme. Pepper. Balsalmic vinegar. Tahini. Hummus. Tomato sauce. Olives. Mushrooms. ? Limit these ? Grains: Prepackaged pasta or rice dishes. Prepackaged cereal with added sugar. ? Vegetables: Deep fried potatoes (french fries). ? Fruits: Fruit canned in syrup. ? Meats and other protein foods: Beef. Pork. Lamb. Poultry with skin. Hot dogs. [place] Salines. ? Dairy: Ice cream. Sour cream. Whole milk. ? Beverages: Juice. Sugar-sweetened soft drinks. Beer. Liquor and spirits. ? Fats and oils: Butter. Canola oil. Vegetable oil. Beef fat (tallow). Lard. ? Sweets and desserts: Cookies. Cakes. Pies. Candy. ? Seasoning and other foods: Mayonnaise. Premade sauces and marinades. ? The items listed may not be a complete list. Talk with your dietitian about what dietary choices are right for you. Summary  The Mediterranean diet includes both food and lifestyle choices.  Eat a variety of fresh fruits and vegetables, beans, nuts, seeds, and whole grains.  Limit the amount of red meat and sweets that you eat.  Talk with your health care provider about whether it is safe for you to drink red wine in moderation. This means 1 glass a day for nonpregnant women and 2 glasses a day for men. A glass of wine equals 5 oz (150 mL). This information  is not intended to replace advice given to you by your health care provider. Make sure you discuss any questions you have with your health care provider. Document Released: 02/21/2016 Document Revised: 03/25/2016 Document Reviewed: 02/21/2016 Elsevier Interactive Patient Education  2019 [place].   Please check you blood pressure and heart rate several times/week. If consistently >140/80, then please call clinic. Follow Mediterranean diet. Increase daily walking- strive for 3mins 5 times/week. Please schedule fasting lab appt in 1-2 weeks. Please schedule complete physical in 3 months. If Left Side rib/thorax pain worsens, please call clinic for chest xray. COVID-19 Education: Continue to follow the Stay at Home order, and when out- Social Distance and wearing a facial mask. WELCOME TO THE PRACTICE!

## 2018-11-30 NOTE — Assessment & Plan Note (Signed)
BP today 139/88, HR 57

## 2018-11-30 NOTE — Telephone Encounter (Signed)
Per review of Epic, patient scheduled to see PCP today. Left detailed message previously.   Routing to Dr. Lestine Box.   Encounter closed.

## 2018-11-30 NOTE — Assessment & Plan Note (Signed)
Current wt 192 Body mass index is 32.82 kg/m. Mediterranean diet Increase daily walking- 31mins 5 times/week Weight loss will help reduce blood pressure and cholesterol levels

## 2018-12-01 ENCOUNTER — Other Ambulatory Visit (INDEPENDENT_AMBULATORY_CARE_PROVIDER_SITE_OTHER): Payer: BLUE CROSS/BLUE SHIELD

## 2018-12-01 DIAGNOSIS — R030 Elevated blood-pressure reading, without diagnosis of hypertension: Secondary | ICD-10-CM | POA: Diagnosis not present

## 2018-12-01 DIAGNOSIS — E7800 Pure hypercholesterolemia, unspecified: Secondary | ICD-10-CM | POA: Diagnosis not present

## 2018-12-01 DIAGNOSIS — E669 Obesity, unspecified: Secondary | ICD-10-CM | POA: Diagnosis not present

## 2018-12-02 LAB — COMPREHENSIVE METABOLIC PANEL
ALT: 11 IU/L (ref 0–32)
AST: 14 IU/L (ref 0–40)
Albumin/Globulin Ratio: 1.7 (ref 1.2–2.2)
Albumin: 4.3 g/dL (ref 3.8–4.9)
Alkaline Phosphatase: 71 IU/L (ref 39–117)
BUN/Creatinine Ratio: 19 (ref 9–23)
BUN: 18 mg/dL (ref 6–24)
Bilirubin Total: 0.3 mg/dL (ref 0.0–1.2)
CO2: 23 mmol/L (ref 20–29)
Calcium: 9.5 mg/dL (ref 8.7–10.2)
Chloride: 105 mmol/L (ref 96–106)
Creatinine, Ser: 0.97 mg/dL (ref 0.57–1.00)
GFR calc Af Amer: 77 mL/min/{1.73_m2} (ref >59)
GFR calc non Af Amer: 67 mL/min/{1.73_m2} (ref >59)
Globulin, Total: 2.6 g/dL (ref 1.5–4.5)
Glucose: 89 mg/dL (ref 65–99)
Potassium: 5 mmol/L (ref 3.5–5.2)
Sodium: 141 mmol/L (ref 134–144)
Total Protein: 6.9 g/dL (ref 6.0–8.5)

## 2018-12-02 LAB — CBC WITH DIFFERENTIAL/PLATELET
Basophils Absolute: 0 10*3/uL (ref 0.0–0.2)
Basos: 0 %
EOS (ABSOLUTE): 0.1 10*3/uL (ref 0.0–0.4)
Eos: 2 %
Hematocrit: 43.4 % (ref 34.0–46.6)
Hemoglobin: 14.2 g/dL (ref 11.1–15.9)
Immature Grans (Abs): 0 10*3/uL (ref 0.0–0.1)
Immature Granulocytes: 0 %
Lymphocytes Absolute: 1.5 10*3/uL (ref 0.7–3.1)
Lymphs: 30 %
MCH: 29.1 pg (ref 26.6–33.0)
MCHC: 32.7 g/dL (ref 31.5–35.7)
MCV: 89 fL (ref 79–97)
Monocytes Absolute: 0.4 10*3/uL (ref 0.1–0.9)
Monocytes: 8 %
Neutrophils Absolute: 3 10*3/uL (ref 1.4–7.0)
Neutrophils: 60 %
Platelets: 332 10*3/uL (ref 150–450)
RBC: 4.88 x10E6/uL (ref 3.77–5.28)
RDW: 12.5 % (ref 11.7–15.4)
WBC: 5 10*3/uL (ref 3.4–10.8)

## 2018-12-02 LAB — LIPID PANEL
Chol/HDL Ratio: 3.5 ratio (ref 0.0–4.4)
Cholesterol, Total: 225 mg/dL — ABNORMAL HIGH (ref 100–199)
HDL: 64 mg/dL (ref >39)
LDL Calculated: 128 mg/dL — ABNORMAL HIGH (ref 0–99)
Triglycerides: 165 mg/dL — ABNORMAL HIGH (ref 0–149)
VLDL Cholesterol Cal: 33 mg/dL (ref 5–40)

## 2018-12-02 LAB — TSH: TSH: 2.34 u[IU]/mL (ref 0.450–4.500)

## 2018-12-02 LAB — HEMOGLOBIN A1C
Est. average glucose Bld gHb Est-mCnc: 108 mg/dL
Hgb A1c MFr Bld: 5.4 % (ref 4.8–5.6)

## 2018-12-03 DIAGNOSIS — S40022A Contusion of left upper arm, initial encounter: Secondary | ICD-10-CM | POA: Diagnosis not present

## 2018-12-03 DIAGNOSIS — R0781 Pleurodynia: Secondary | ICD-10-CM | POA: Diagnosis not present

## 2018-12-03 DIAGNOSIS — M79622 Pain in left upper arm: Secondary | ICD-10-CM | POA: Diagnosis not present

## 2018-12-03 DIAGNOSIS — W101XXA Fall (on)(from) sidewalk curb, initial encounter: Secondary | ICD-10-CM | POA: Diagnosis not present

## 2018-12-03 DIAGNOSIS — S299XXA Unspecified injury of thorax, initial encounter: Secondary | ICD-10-CM | POA: Diagnosis not present

## 2018-12-22 ENCOUNTER — Ambulatory Visit: Payer: BLUE CROSS/BLUE SHIELD

## 2019-01-10 DIAGNOSIS — Z20828 Contact with and (suspected) exposure to other viral communicable diseases: Secondary | ICD-10-CM | POA: Diagnosis not present

## 2019-03-03 ENCOUNTER — Encounter: Payer: BLUE CROSS/BLUE SHIELD | Admitting: Adult Health

## 2019-03-16 ENCOUNTER — Ambulatory Visit
Admission: RE | Admit: 2019-03-16 | Discharge: 2019-03-16 | Disposition: A | Discharge status: Home / Self Care | Payer: BC Managed Care – PPO | Source: Ambulatory Visit | Attending: Obstetrics & Gynecology | Admitting: Obstetrics & Gynecology

## 2019-03-16 ENCOUNTER — Other Ambulatory Visit: Payer: Self-pay

## 2019-03-16 DIAGNOSIS — Z1231 Encounter for screening mammogram for malignant neoplasm of breast: Secondary | ICD-10-CM | POA: Diagnosis not present

## 2019-04-12 ENCOUNTER — Emergency Department
Admission: EM | Admit: 2019-04-12 | Discharge: 2019-04-12 | Disposition: A | Discharge status: Home / Self Care | Payer: BC Managed Care – PPO | Attending: Emergency Medicine | Admitting: Emergency Medicine

## 2019-04-12 ENCOUNTER — Encounter: Payer: Self-pay | Admitting: *Deleted

## 2019-04-12 ENCOUNTER — Emergency Department: Payer: BC Managed Care – PPO

## 2019-04-12 ENCOUNTER — Other Ambulatory Visit: Payer: Self-pay

## 2019-04-12 DIAGNOSIS — Z7982 Long term (current) use of aspirin: Secondary | ICD-10-CM | POA: Diagnosis not present

## 2019-04-12 DIAGNOSIS — Y92009 Unspecified place in unspecified non-institutional (private) residence as the place of occurrence of the external cause: Secondary | ICD-10-CM | POA: Diagnosis not present

## 2019-04-12 DIAGNOSIS — M7989 Other specified soft tissue disorders: Secondary | ICD-10-CM | POA: Diagnosis not present

## 2019-04-12 DIAGNOSIS — Y9389 Activity, other specified: Secondary | ICD-10-CM | POA: Insufficient documentation

## 2019-04-12 DIAGNOSIS — S6991XA Unspecified injury of right wrist, hand and finger(s), initial encounter: Secondary | ICD-10-CM | POA: Diagnosis not present

## 2019-04-12 DIAGNOSIS — S0181XA Laceration without foreign body of other part of head, initial encounter: Secondary | ICD-10-CM | POA: Insufficient documentation

## 2019-04-12 DIAGNOSIS — Y999 Unspecified external cause status: Secondary | ICD-10-CM | POA: Insufficient documentation

## 2019-04-12 DIAGNOSIS — W208XXA Other cause of strike by thrown, projected or falling object, initial encounter: Secondary | ICD-10-CM | POA: Diagnosis not present

## 2019-04-12 DIAGNOSIS — Z79899 Other long term (current) drug therapy: Secondary | ICD-10-CM | POA: Insufficient documentation

## 2019-04-12 DIAGNOSIS — Z87891 Personal history of nicotine dependence: Secondary | ICD-10-CM | POA: Insufficient documentation

## 2019-04-12 DIAGNOSIS — W19XXXA Unspecified fall, initial encounter: Secondary | ICD-10-CM

## 2019-04-12 DIAGNOSIS — S60211A Contusion of right wrist, initial encounter: Secondary | ICD-10-CM | POA: Diagnosis not present

## 2019-04-12 DIAGNOSIS — Z23 Encounter for immunization: Secondary | ICD-10-CM | POA: Insufficient documentation

## 2019-04-12 DIAGNOSIS — S0993XA Unspecified injury of face, initial encounter: Secondary | ICD-10-CM | POA: Diagnosis not present

## 2019-04-12 MED ORDER — TETANUS-DIPHTH-ACELL PERTUSSIS 5-2.5-18.5 LF-MCG/0.5 IM SUSP
0.5000 mL | Freq: Once | INTRAMUSCULAR | Status: AC
  Administered 2019-04-12: 18:00:00 0.5 mL via INTRAMUSCULAR
  Filled 2019-04-12: qty 0.5

## 2019-04-12 MED ORDER — KETOROLAC TROMETHAMINE 30 MG/ML IJ SOLN
30.0000 mg | Freq: Once | INTRAMUSCULAR | Status: AC
  Administered 2019-04-12: 30 mg via INTRAMUSCULAR
  Filled 2019-04-12: qty 1

## 2019-04-12 MED ORDER — MELOXICAM 15 MG PO TABS: 15.0000 mg | ORAL_TABLET | Freq: Every day | ORAL | 0 refills | Status: DC

## 2019-04-12 NOTE — ED Provider Notes (Signed)
[facility] Emergency Department Provider Note  ____________________________________________  Time seen: Approximately 4:29 PM  I have reviewed the triage vital signs and the nursing notes.   HISTORY  Chief Complaint Fall    HPI Traci Caldwell is a 53 y.o. female who presents the emergency department complaining of a laceration to the left frontal skull, injury to the right wrist.  Patient reports that she was at her father's house helping around the house by drilling some holes into his outside trash can to allow water drainage.  Patient reports that the lid of the trash can fell striking her in the head causing her to fall onto an outstretched hand.  Patient is complaining primarily of right wrist pain at this time.  Patient does have an obvious deformity but is able to move all the digits on her right hand.  She did not lose consciousness.  She denies any headache, visual changes, neck pain, chest pain, shortness of breath, abdominal pain, nausea vomiting.         Past Medical History:  Diagnosis Date  . 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 Obesity (BMI 30.0-34.9) 11/30/2018  . Elevated LDL cholesterol level 11/30/2018  . Elevated blood pressure reading 11/30/2018  . Healthcare maintenance 11/30/2018  . Left-sided chest pain 11/30/2018  . White coat syndrome without diagnosis of hypertension 09/01/2016  . Breast density 09/01/2016  . Former smoker 09/01/2016    Past Surgical History:  Procedure Laterality Date  . COLPOSCOPY  08/2004   CIN I  . TUBAL LIGATION  1996  . VAGINAL HYSTERECTOMY  12/2007   TVH- due to fibroids & bleeding    Prior to Admission medications   Medication Sig Start Date End Date Taking? Authorizing Provider  aspirin EC 81 MG tablet Take 81 mg by mouth daily.    [provider]  Cholecalciferol (VITAMIN D3) 125 MCG  (5000 UT) TABS Take 2 tablets by mouth. Take 2 tablets daily Monday thru Friday once per month    [provider]  Ibuprofen (ADVIL PO) Take by mouth as needed.    [provider]  meloxicam (MOBIC) 15 MG tablet Take 1 tablet (15 mg total) by mouth daily. 04/12/19   Devory Mckinzie, Charline Bills, PA-C    Allergies Penicillins  Family History  Problem Relation Age of Onset  . Hypertension Mother   . Depression Mother   . Hypertension Father   . Hyperlipidemia Father   . Stroke Father   . Endometriosis Sister   . Diabetes Paternal Uncle   . Cancer Maternal Grandmother        stomach  . Hypertension Maternal Grandmother   . Hypertension Maternal Grandfather   . Diabetes Paternal Grandmother   . Hypertension Paternal Grandmother   . Hypertension Paternal Grandfather   . Stroke Paternal Grandfather     Social History Social History   Tobacco Use  . Smoking status: Former Smoker    Packs/day: 1.00    Years: 20.00    Pack years: 20.00    Types: Cigarettes    Quit date: 07/14/2010    Years since quitting: 8.7  . Smokeless tobacco: Never Used  Substance Use Topics  . Alcohol use: No  . Drug use: No     Review of Systems  Constitutional: No fever/chills Eyes: No visual changes. No discharge ENT: No upper respiratory complaints. Cardiovascular: no chest  pain. Respiratory: no cough. No SOB. Gastrointestinal: No abdominal pain.  No nausea, no vomiting.  Musculoskeletal: Positive for right wrist injury Skin: Positive for frontal skull laceration Neurological: Negative for headaches, focal weakness or numbness. 10-point ROS otherwise negative.  ____________________________________________   PHYSICAL EXAM:  VITAL SIGNS: ED Triage Vitals  Enc Vitals Group     BP 04/12/19 1616 (!) 177/98     Pulse Rate 04/12/19 1616 79     Resp 04/12/19 1616 (!) 24     Temp 04/12/19 1616 98.2 F (36.8 C)     Temp Source 04/12/19 1616 Oral     SpO2 04/12/19 1616 99 %      Weight 04/12/19 1618 190 lb (86.2 kg)     Height 04/12/19 1618 5\' 3"  (1.6 m)     Head Circumference --      Peak Flow --      Pain Score 04/12/19 1618 8     Pain Loc --      Pain Edu? --      Excl. in [place]? --      Constitutional: Alert and oriented. Well appearing and in no acute distress. Eyes: Conjunctivae are normal. PERRL. EOMI. Head: 2 cm linear laceration noted to the right frontal scalp.  Edges are smooth in nature.  Well approximated.  No bleeding.  No foreign body.  No surrounding edema or ecchymosis.  Nontender to palpation over the underlying osseous structures of the skull and face.  No battle signs, raccoon eyes, serosanguineous fluid drainage from the ears or nares. ENT:      Ears:       Nose: No congestion/rhinnorhea.      Mouth/Throat: Mucous membranes are moist.  Neck: No stridor.  No cervical spine tenderness to palpation.  Cardiovascular: Normal rate, regular rhythm. Normal S1 and S2.  Good peripheral circulation. Respiratory: Normal respiratory effort without tachypnea or retractions. Lungs CTAB. Good air entry to the bases with no decreased or absent breath sounds. Musculoskeletal: Full range of motion to all extremities. No gross deformities appreciated.  Visualization of the right wrist reveals obvious deformity to the right wrist.  Majority of deformity is along the lateral aspect over the distal radius.  No range of motion of the wrist at this time.  Full range of motion to the elbow, all digits right hand.  Patient is exquisitely tender to palpation of the distal radius with palpable abnormality.  Sensation intact all digits. Neurologic:  Normal speech and language. No gross focal neurologic deficits are appreciated.  Skin:  Skin is warm, dry and intact. No rash noted. Psychiatric: Mood and affect are normal. Speech and behavior are normal. Patient exhibits appropriate insight and judgement.   ____________________________________________   LABS (all labs ordered  are listed, but only abnormal results are displayed)  Labs Reviewed - No data to display ____________________________________________  EKG   ____________________________________________  RADIOLOGY I personally viewed and evaluated these images as part of my medical decision making, as well as reviewing the written report by the radiologist.  Dg Wrist Complete Right  Result Date: 04/12/2019 CLINICAL DATA:  Fall into a trash can, abnormal deformity/swelling along the wrist. EXAM: RIGHT WRIST - COMPLETE 3+ VIEW COMPARISON:  None. FINDINGS: Considerable soft tissue swelling volar and lateral to the distal radius and wrist probably from hematoma. There is some bony ridging along the distal pole of the scaphoid but I do not see a definite scaphoid fracture or other acute bony fracture. Pronator fat pad is  not displaced. IMPRESSION: 1. Prominent soft tissue swelling or hematoma along the volar and lateral wrist. I not see a definite fracture. If there is a high clinical suspicion of occult fracture then CT could be utilized for further characterization or surveillance radiography could be obtained in 1 week's time. Electronically Signed   By: Van Clines M.D.   On: 04/12/2019 16:41    ____________________________________________    PROCEDURES  Procedure(s) performed:    Marland KitchenMarland KitchenLaceration Repair  Date/Time: 04/12/2019 5:38 PM Performed by: Darletta Moll, PA-C Authorized by: Darletta Moll, PA-C   Consent:    Consent obtained:  Verbal   Consent given by:  Patient   Risks discussed:  Pain and poor cosmetic result Anesthesia (see MAR for exact dosages):    Anesthesia method:  None Laceration details:    Location:  Face   Face location:  Forehead   Length (cm):  2 Repair type:    Repair type:  Simple Exploration:    Hemostasis achieved with:  Direct pressure   Wound extent: no foreign bodies/material noted, no nerve damage noted, no tendon damage noted and no  underlying fracture noted     Contaminated: no   Treatment:    Area cleansed with:  Shur-Clens and saline   Amount of cleaning:  Standard Skin repair:    Repair method: DermaClips. Approximation:    Approximation:  Close Post-procedure details:    Dressing:  Open (no dressing)   Patient tolerance of procedure:  Tolerated well, no immediate complications Comments:     2 derma clips applied with good success.  Edges are well approximated. Marland KitchenSplint Application  Date/Time: 04/12/2019 5:39 PM Performed by: Darletta Moll, PA-C Authorized by: Darletta Moll, PA-C   Consent:    Consent obtained:  Verbal   Consent given by:  Patient   Risks discussed:  Pain and swelling Pre-procedure details:    Sensation:  Normal Procedure details:    Laterality:  Right   Location:  Wrist   Wrist:  R wrist   Splint type:  Wrist   Supplies:  Prefabricated splint Post-procedure details:    Pain:  Improved   Sensation:  Normal   Patient tolerance of procedure:  Tolerated well, no immediate complications      Medications  ketorolac (TORADOL) 30 MG/ML injection 30 mg (has no administration in time range)  Tdap (BOOSTRIX) injection 0.5 mL (has no administration in time range)     ____________________________________________   INITIAL IMPRESSION / ASSESSMENT AND PLAN / ED COURSE  Pertinent labs & imaging results that were available during my care of the patient were reviewed by me and considered in my medical decision making (see chart for details).  Review of the [place] CSRS was performed in accordance of the [place] prior to dispensing any controlled drugs.           Patient's diagnosis is consistent with fall resulting in forehead laceration and right wrist injury.  Patient presented to the emergency department complaining of laceration to the forehead but mostly right wrist pain after a fall.  Patient did have a deformity, however imaging reveals that this is a large hematoma  with no osseous injury.  Patient will be placed in a Velcro wrist splint at this time for pain improvement and stabilization of the wrist.  She is to follow-up with orthopedics to ensure proper healing.  [place] does not want any narcotics so meloxicam will be prescribed.  Patient's laceration is closed using dermal clips  as described above.  Patient tolerated well.  Tetanus shot updated today.  Follow-up with orthopedics as described above or primary care as needed..  Patient is given ED precautions to return to the ED for any worsening or new symptoms.     ____________________________________________  FINAL CLINICAL IMPRESSION(S) / ED DIAGNOSES  Final diagnoses:  Fall, initial encounter  Laceration of forehead, initial encounter  Injury of right wrist, initial encounter      NEW MEDICATIONS STARTED DURING THIS VISIT:  ED Discharge Orders         Ordered    meloxicam (MOBIC) 15 MG tablet  Daily     04/12/19 1741              This chart was dictated using voice recognition software/Dragon. Despite best efforts to proofread, errors can occur which can change the meaning. Any change was purely unintentional.    Darletta Moll, PA-C 04/12/19 1745    Nance Pear, MD 04/12/19 [id number]

## 2019-04-12 NOTE — ED Triage Notes (Signed)
Pt fell into a trash can, injured forehead and has obvious deformity of R wrist. Pt has small laceration to R forehead. Pt denies LoC and is A&O x 4. Pt does not want narcotics.

## 2019-04-15 ENCOUNTER — Other Ambulatory Visit: Payer: Self-pay

## 2019-04-15 ENCOUNTER — Encounter: Payer: Self-pay | Admitting: Intensive Care

## 2019-04-15 ENCOUNTER — Emergency Department: Payer: BC Managed Care – PPO

## 2019-04-15 ENCOUNTER — Emergency Department
Admission: EM | Admit: 2019-04-15 | Discharge: 2019-04-15 | Disposition: A | Discharge status: Home / Self Care | Payer: BC Managed Care – PPO | Attending: Emergency Medicine | Admitting: Emergency Medicine

## 2019-04-15 DIAGNOSIS — G44319 Acute post-traumatic headache, not intractable: Secondary | ICD-10-CM | POA: Insufficient documentation

## 2019-04-15 DIAGNOSIS — R519 Headache, unspecified: Secondary | ICD-10-CM | POA: Diagnosis not present

## 2019-04-15 DIAGNOSIS — Z87891 Personal history of nicotine dependence: Secondary | ICD-10-CM | POA: Insufficient documentation

## 2019-04-15 DIAGNOSIS — Z7982 Long term (current) use of aspirin: Secondary | ICD-10-CM | POA: Diagnosis not present

## 2019-04-15 DIAGNOSIS — Z79899 Other long term (current) drug therapy: Secondary | ICD-10-CM | POA: Diagnosis not present

## 2019-04-15 DIAGNOSIS — S0990XA Unspecified injury of head, initial encounter: Secondary | ICD-10-CM | POA: Diagnosis not present

## 2019-04-15 NOTE — Discharge Instructions (Signed)
Follow-up with your primary care provider if any continued problems.  Continue taking the meloxicam as needed for your arm and also you may take Tylenol as needed for your headache.  These 2 medications will not interact with each other.  Increase fluids.

## 2019-04-15 NOTE — ED Triage Notes (Signed)
Patient had episode of hitting head on 9/29. No LOC. Denies having any scans. Patient is back today for headache that will not subside. Denies blurry vision

## 2019-04-15 NOTE — ED Provider Notes (Signed)
[facility] Emergency Department Provider Note  ____________________________________________   First MD Initiated Contact with Patient 04/15/19 [phone]     (approximate)  I have reviewed the triage vital signs and the nursing notes.   HISTORY  Chief Complaint Headache   HPI Traci Caldwell is a 53 y.o. female presents to the ED with complaint of continued headache since 04/12/2019.  Patient was seen in the ED at that time due to a fall and was evaluated for right wrist injury.  Patient states that her wrist is feeling better.  She is continued to have right-sided headaches since that time that is unrelieved by over-the-counter medication.  She denies any visual changes, nausea, vomiting or dizziness.  Patient has continued to ambulate without any assistance.  She rates her pain as 5/10.       Past Medical History:  Diagnosis Date  . 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 Obesity (BMI 30.0-34.9) 11/30/2018  . Elevated LDL cholesterol level 11/30/2018  . Elevated blood pressure reading 11/30/2018  . Healthcare maintenance 11/30/2018  . Left-sided chest pain 11/30/2018  . White coat syndrome without diagnosis of hypertension 09/01/2016  . Breast density 09/01/2016  . Former smoker 09/01/2016    Past Surgical History:  Procedure Laterality Date  . COLPOSCOPY  08/2004   CIN I  . TUBAL LIGATION  1996  . VAGINAL HYSTERECTOMY  12/2007   TVH- due to fibroids & bleeding    Prior to Admission medications   Medication Sig Start Date End Date Taking? Authorizing Provider  aspirin EC 81 MG tablet Take 81 mg by mouth daily.    [provider]  Cholecalciferol (VITAMIN D3) 125 MCG (5000 UT) TABS Take 2 tablets by mouth. Take 2 tablets daily Monday thru Friday once per month    [provider]  Ibuprofen (ADVIL PO) Take by mouth as needed.     [provider]  meloxicam (MOBIC) 15 MG tablet Take 1 tablet (15 mg total) by mouth daily. 04/12/19   Cuthriell, Charline Bills, PA-C    Allergies Penicillins  Family History  Problem Relation Age of Onset  . Hypertension Mother   . Depression Mother   . Hypertension Father   . Hyperlipidemia Father   . Stroke Father   . Endometriosis Sister   . Diabetes Paternal Uncle   . Cancer Maternal Grandmother        stomach  . Hypertension Maternal Grandmother   . Hypertension Maternal Grandfather   . Diabetes Paternal Grandmother   . Hypertension Paternal Grandmother   . Hypertension Paternal Grandfather   . Stroke Paternal Grandfather     Social History Social History   Tobacco Use  . Smoking status: Former Smoker    Packs/day: 1.00    Years: 20.00    Pack years: 20.00    Types: Cigarettes    Quit date: 07/14/2010    Years since quitting: 8.7  . Smokeless tobacco: Never Used  Substance Use Topics  . Alcohol use: No  . Drug use: No    Review of Systems Constitutional: No fever/chills Eyes: No visual changes. ENT: No trauma. Cardiovascular: Denies chest pain. Respiratory: Denies shortness of breath. Gastrointestinal: No abdominal pain.  No nausea, no vomiting.   Musculoskeletal: Positive right forearm pain. Skin: Steri-Strips were applied to right forehead. Neurological: Positive for headaches, negative for focal weakness or numbness. ___________________________________________  PHYSICAL EXAM:  VITAL SIGNS: ED Triage Vitals  Enc Vitals Group     BP 04/15/19 0843 (!) 159/99     Pulse Rate 04/15/19 0843 64     Resp 04/15/19 0843 14     Temp 04/15/19 0843 98.3 F (36.8 C)     Temp Source 04/15/19 0843 Oral     SpO2 04/15/19 0843 98 %     Weight 04/15/19 0845 190 lb (86.2 kg)     Height 04/15/19 0845 5' 3.5" (1.613 m)     Head Circumference --      Peak Flow --      Pain Score 04/15/19 0844 5     Pain Loc --      Pain Edu? --      Excl. in [place]? --     Constitutional: Alert and oriented. Well appearing and in no acute distress. Eyes: Conjunctivae are normal. PERRL. EOMI. Head: Atraumatic. Neck: No stridor.  No cervical tenderness on palpation posteriorly. Cardiovascular: Normal rate, regular rhythm. Grossly normal heart sounds.  Good peripheral circulation. Respiratory: Normal respiratory effort.  No retractions. Lungs CTAB. Musculoskeletal: Moves upper and lower extremities without any difficulty.  Patient continues to wear her cock-up wrist splint on her right arm. Neurologic:  Normal speech and language. No gross focal neurologic deficits are appreciated. No gait instability. Skin:  Skin is warm, dry.  Healing wound right forehead with Steri-Strips still attached. Psychiatric: Mood and affect are normal. Speech and behavior are normal.  ____________________________________________   LABS (all labs ordered are listed, but only abnormal results are displayed)  Labs Reviewed - No data to display  RADIOLOGY  Official radiology report(s): Ct Head Wo Contrast  Result Date: 04/15/2019 CLINICAL DATA:  [place] on 04/12/2019. Right frontal head injury. No reported loss of consciousness. EXAM: CT HEAD WITHOUT CONTRAST TECHNIQUE: Contiguous axial images were obtained from the base of the skull through the vertex without intravenous contrast. COMPARISON:  None. FINDINGS: Brain: No evidence of acute infarction, hemorrhage, hydrocephalus, extra-axial collection or mass lesion/mass effect. Vascular: No hyperdense vessel or unexpected calcification. Skull: Normal. Negative for fracture or focal lesion. Sinuses/Orbits: Visualized globes and orbits are unremarkable. The visualized sinuses and mastoid air cells are clear. Other: Minor right frontal scalp contusion. IMPRESSION: 1. No intracranial abnormality.  No skull fracture. Electronically Signed   By: Lajean Manes M.D.   On: 04/15/2019 10:25    ____________________________________________    PROCEDURES  Procedure(s) performed (including Critical Care):  Procedures ____________________________________________   INITIAL IMPRESSION / ASSESSMENT AND PLAN / ED COURSE  As part of my medical decision making, I reviewed the following data within the electronic MEDICAL RECORD NUMBER Notes from prior ED visits and [place] Controlled Substance Database  53 year old female presents to the ED for right-sided headache since her fall on 04/12/2019.  Physical exam was unremarkable.  CT scan was negative and patient was made aware.  She will continue on the meloxicam and states that she does not want any controlled medications.  She was advised to take Tylenol with the meloxicam if needed for headache.  She is to follow-up with her PCP if any continued problems. ____________________________________________   FINAL CLINICAL IMPRESSION(S) / ED DIAGNOSES  Final diagnoses:  Acute post-traumatic headache, not intractable     ED Discharge Orders    None       Note:  This document was prepared using Dragon voice recognition software and may include unintentional dictation errors.    Johnn Hai, PA-C  04/15/19 1058    Delman Kitten, MD 04/15/19 1152

## 2019-04-21 DIAGNOSIS — M25531 Pain in right wrist: Secondary | ICD-10-CM | POA: Diagnosis not present

## 2019-04-21 DIAGNOSIS — W010XXA Fall on same level from slipping, tripping and stumbling without subsequent striking against object, initial encounter: Secondary | ICD-10-CM | POA: Diagnosis not present

## 2019-04-21 DIAGNOSIS — S01411A Laceration without foreign body of right cheek and temporomandibular area, initial encounter: Secondary | ICD-10-CM | POA: Diagnosis not present

## 2019-04-21 DIAGNOSIS — S60211A Contusion of right wrist, initial encounter: Secondary | ICD-10-CM | POA: Diagnosis not present

## 2019-04-30 ENCOUNTER — Other Ambulatory Visit: Payer: Self-pay

## 2019-04-30 ENCOUNTER — Emergency Department
Admission: EM | Admit: 2019-04-30 | Discharge: 2019-04-30 | Disposition: A | Discharge status: Home / Self Care | Payer: BC Managed Care – PPO | Attending: Emergency Medicine | Admitting: Emergency Medicine

## 2019-04-30 ENCOUNTER — Encounter: Payer: Self-pay | Admitting: Emergency Medicine

## 2019-04-30 DIAGNOSIS — Z87891 Personal history of nicotine dependence: Secondary | ICD-10-CM | POA: Insufficient documentation

## 2019-04-30 DIAGNOSIS — Z7982 Long term (current) use of aspirin: Secondary | ICD-10-CM | POA: Diagnosis not present

## 2019-04-30 DIAGNOSIS — R519 Headache, unspecified: Secondary | ICD-10-CM | POA: Diagnosis not present

## 2019-04-30 DIAGNOSIS — F0781 Postconcussional syndrome: Secondary | ICD-10-CM | POA: Diagnosis not present

## 2019-04-30 DIAGNOSIS — Z79899 Other long term (current) drug therapy: Secondary | ICD-10-CM | POA: Diagnosis not present

## 2019-04-30 MED ORDER — LIDOCAINE 5 % EX PTCH
1.0000 | MEDICATED_PATCH | CUTANEOUS | Status: DC
  Administered 2019-04-30: 1 via TRANSDERMAL
  Filled 2019-04-30: qty 1

## 2019-04-30 NOTE — Discharge Instructions (Signed)
Applies small Lidoderm patches every 12 hours as directed.

## 2019-04-30 NOTE — ED Provider Notes (Signed)
[facility] Emergency Department Provider Note   ____________________________________________   First MD Initiated Contact with Patient 04/30/19 0940     (approximate)  I have reviewed the triage vital signs and the nursing notes.   HISTORY  Chief Complaint Follow-up    HPI Traci Caldwell is a 53 y.o. female patient is here today for follow-up secondary to a head injury which occurred on 04/12/2019.  Patient sustained a laceration which was sutured.  Patient return back in 04/15/2019 for right-sided headache which started with a head injury and has continued.  Patient had a negative CT scan performed that day.  Patient reports today that headache is not resolved with NSAIDs.  Patient states that the times she is has mild transit vertigo.  No vision disturbance.  Patient said the pain starts at the incision site of her right lateral forehead and spreads to the right parietal area.  Patient described the pain as "achy".  Patient rates the pain as a 6/10.     Past Medical History:  Diagnosis Date  . 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 Obesity (BMI 30.0-34.9) 11/30/2018  . Elevated LDL cholesterol level 11/30/2018  . Elevated blood pressure reading 11/30/2018  . Healthcare maintenance 11/30/2018  . Left-sided chest pain 11/30/2018  . White coat syndrome without diagnosis of hypertension 09/01/2016  . Breast density 09/01/2016  . Former smoker 09/01/2016    Past Surgical History:  Procedure Laterality Date  . COLPOSCOPY  08/2004   CIN I  . TUBAL LIGATION  1996  . VAGINAL HYSTERECTOMY  12/2007   TVH- due to fibroids & bleeding    Prior to Admission medications   Medication Sig Start Date End Date Taking? Authorizing Provider  aspirin EC 81 MG tablet Take 81 mg by mouth daily.    [provider]  Cholecalciferol (VITAMIN D3) 125 MCG (5000  UT) TABS Take 2 tablets by mouth. Take 2 tablets daily Monday thru Friday once per month    [provider]  Ibuprofen (ADVIL PO) Take by mouth as needed.    [provider]  meloxicam (MOBIC) 15 MG tablet Take 1 tablet (15 mg total) by mouth daily. 04/12/19   Cuthriell, Charline Bills, PA-C    Allergies Penicillins  Family History  Problem Relation Age of Onset  . Hypertension Mother   . Depression Mother   . Hypertension Father   . Hyperlipidemia Father   . Stroke Father   . Endometriosis Sister   . Diabetes Paternal Uncle   . Cancer Maternal Grandmother        stomach  . Hypertension Maternal Grandmother   . Hypertension Maternal Grandfather   . Diabetes Paternal Grandmother   . Hypertension Paternal Grandmother   . Hypertension Paternal Grandfather   . Stroke Paternal Grandfather     Social History Social History   Tobacco Use  . Smoking status: Former Smoker    Packs/day: 1.00    Years: 20.00    Pack years: 20.00    Types: Cigarettes    Quit date: 07/14/2010    Years since quitting: 8.8  . Smokeless tobacco: Never Used  Substance Use Topics  . Alcohol use: No  . Drug use: No    Review of Systems  Constitutional: No fever/chills Eyes: No visual changes. ENT: No sore throat. Cardiovascular: Denies chest pain. Respiratory: Denies shortness of breath. Gastrointestinal:  No abdominal pain.  No nausea, no vomiting.  No diarrhea.  No constipation. Genitourinary: Negative for dysuria. Musculoskeletal: Negative for back pain. Skin: Negative for rash. Neurological: Positive for headaches, focal weakness or numbness. Psychiatric:  Whitecoat syndrome  Allergic/Immunilogical: Penicillin  ____________________________________________   PHYSICAL EXAM:  VITAL SIGNS: ED Triage Vitals [04/30/19 0934]  Enc Vitals Group     BP (!) 171/96     Pulse Rate 62     Resp 16     Temp 97.8 F (36.6 C)     Temp Source Oral     SpO2 99 %     Weight 190 lb  (86.2 kg)     Height 5\' 3"  (1.6 m)     Head Circumference      Peak Flow      Pain Score 6     Pain Loc      Pain Edu?      Excl. in [place]?     Constitutional: Alert and oriented. Well appearing and in no acute distress. Cardiovascular: Normal rate, regular rhythm. Grossly normal heart sounds.  Good peripheral circulation.  Evaded blood pressure Respiratory: Normal respiratory effort.  No retractions. Lungs CTAB. Neurologic:  Normal speech and language. No gross focal neurologic deficits are appreciated. No gait instability. Skin:  Skin is warm, dry and intact. No rash noted.  Healed laceration right forehead.  Moderate guarding palpation. Psychiatric: Mood and affect are normal. Speech and behavior are normal.  ____________________________________________   LABS (all labs ordered are listed, but only abnormal results are displayed)  Labs Reviewed - No data to display ____________________________________________  EKG   ____________________________________________  RADIOLOGY  ED MD interpretation:    Official radiology report(s): No results found.  ____________________________________________   PROCEDURES  Procedure(s) performed (including Critical Care):  Procedures   ____________________________________________   INITIAL IMPRESSION / ASSESSMENT AND PLAN / ED COURSE  As part of my medical decision making, I reviewed the following data within the [place] was evaluated in Emergency Department on 04/30/2019 for the symptoms described in the history of present illness. She was evaluated in the context of the global COVID-19 pandemic, which necessitated consideration that the patient might be at risk for infection with the SARS-CoV-2 virus that causes COVID-19. Institutional protocols and algorithms that pertain to the evaluation of patients at risk for COVID-19 are in a state of rapid change based on information released  by regulatory bodies including the CDC and federal and state organizations. These policies and algorithms were followed during the patient's care in the ED.  Patient presents with continued headache status post head injury on 04/12/2019.  Patient physical exam is consistent with postconcussion headache.  Patient will be consulted neurology for definitive evaluation.      ____________________________________________   FINAL CLINICAL IMPRESSION(S) / ED DIAGNOSES  Final diagnoses:  Postconcussion syndrome     ED Discharge Orders    None       Note:  This document was prepared using Dragon voice recognition software and may include unintentional dictation errors.    Sable Feil, PA-C 04/30/19 [id number]    Nena Polio, MD 04/30/19 1024

## 2019-04-30 NOTE — ED Triage Notes (Signed)
Pt to ED via POV, pt states that she was seen in ED on 9/29 for head injury. Pt states that she was seen again on 04/15/19 and had a CT scan of her head which was negative. Pt states that she is still having pain in the area. Pt is in NAD at this time.

## 2019-04-30 NOTE — ED Notes (Signed)
See triage note  conts to have right sided h/a  States her initial injury was 09/29  And since has been seen at that time and again on 10/02   Denies any any new injury

## 2019-05-03 ENCOUNTER — Other Ambulatory Visit: Payer: Self-pay | Admitting: *Deleted

## 2019-05-03 DIAGNOSIS — Z20828 Contact with and (suspected) exposure to other viral communicable diseases: Secondary | ICD-10-CM | POA: Diagnosis not present

## 2019-05-03 DIAGNOSIS — Z20822 Contact with and (suspected) exposure to covid-19: Secondary | ICD-10-CM

## 2019-05-04 DIAGNOSIS — Z6833 Body mass index (BMI) 33.0-33.9, adult: Secondary | ICD-10-CM | POA: Diagnosis not present

## 2019-05-04 DIAGNOSIS — E559 Vitamin D deficiency, unspecified: Secondary | ICD-10-CM | POA: Diagnosis not present

## 2019-05-04 DIAGNOSIS — E7800 Pure hypercholesterolemia, unspecified: Secondary | ICD-10-CM | POA: Diagnosis not present

## 2019-05-04 DIAGNOSIS — I10 Essential (primary) hypertension: Secondary | ICD-10-CM | POA: Diagnosis not present

## 2019-05-05 LAB — NOVEL CORONAVIRUS, NAA: SARS-CoV-2, NAA: NOT DETECTED

## 2019-06-06 DIAGNOSIS — I10 Essential (primary) hypertension: Secondary | ICD-10-CM | POA: Diagnosis not present

## 2019-06-06 DIAGNOSIS — E7800 Pure hypercholesterolemia, unspecified: Secondary | ICD-10-CM | POA: Diagnosis not present

## 2019-06-06 DIAGNOSIS — E559 Vitamin D deficiency, unspecified: Secondary | ICD-10-CM | POA: Diagnosis not present

## 2019-06-06 DIAGNOSIS — Z2821 Immunization not carried out because of patient refusal: Secondary | ICD-10-CM | POA: Diagnosis not present

## 2019-06-15 DIAGNOSIS — Z9189 Other specified personal risk factors, not elsewhere classified: Secondary | ICD-10-CM | POA: Diagnosis not present

## 2019-06-15 DIAGNOSIS — Z20828 Contact with and (suspected) exposure to other viral communicable diseases: Secondary | ICD-10-CM | POA: Diagnosis not present

## 2019-07-05 DIAGNOSIS — I10 Essential (primary) hypertension: Secondary | ICD-10-CM | POA: Diagnosis not present

## 2019-07-05 DIAGNOSIS — Z20828 Contact with and (suspected) exposure to other viral communicable diseases: Secondary | ICD-10-CM | POA: Diagnosis not present

## 2019-07-19 DIAGNOSIS — Z20822 Contact with and (suspected) exposure to covid-19: Secondary | ICD-10-CM | POA: Diagnosis not present

## 2019-07-19 DIAGNOSIS — Z20828 Contact with and (suspected) exposure to other viral communicable diseases: Secondary | ICD-10-CM | POA: Diagnosis not present

## 2019-07-20 ENCOUNTER — Other Ambulatory Visit: Payer: BC Managed Care – PPO

## 2019-09-14 ENCOUNTER — Other Ambulatory Visit: Payer: Self-pay

## 2019-09-15 ENCOUNTER — Encounter: Payer: Self-pay | Admitting: Obstetrics & Gynecology

## 2019-09-15 ENCOUNTER — Ambulatory Visit (INDEPENDENT_AMBULATORY_CARE_PROVIDER_SITE_OTHER): Payer: BC Managed Care – PPO | Admitting: Obstetrics & Gynecology

## 2019-09-15 VITALS — BP 128/80 | HR 68 | Temp 97.5°F | Resp 12 | Ht 64.0 in | Wt 188.8 lb

## 2019-09-15 DIAGNOSIS — Z01419 Encounter for gynecological examination (general) (routine) without abnormal findings: Secondary | ICD-10-CM

## 2019-09-15 NOTE — Progress Notes (Signed)
54 y.o. G41P2002 Married White or Caucasian female here for annual exam.  Doing well.  Tripped over lid of recycling container and hit head.  Had scalp laceration and concussion.  Had wrist injury with significant bruising but no fracture.    Denies vaginal bleeding.      Patient's last menstrual period was 12/13/2007.          Sexually active: Yes.    The current method of family planning is status post hysterectomy.    Exercising: Yes.    walking Smoker:  no  Health Maintenance: Pap:  09/01/16 Neg History of abnormal Pap:  Yes, CIN I MMG:  03/16/19 BIRADS 1 negative/density c Colonoscopy:  Never.  Cologuard discussed.   BMD:   never TDaP:  04/12/19  Pneumonia vaccine(s):  n/a Shingrix:   no Hep C testing: n/a Screening Labs: done 11/2018 with Mina Marble   reports that she quit smoking about 9 years ago. Her smoking use included cigarettes. She has a 20.00 pack-year smoking history. She has never used smokeless tobacco. She reports that she does not drink alcohol or use drugs.  Past Medical History:  Diagnosis Date  . 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 COLPOSCOPY  08/2004   CIN I  . TUBAL LIGATION  1996  . VAGINAL HYSTERECTOMY  12/2007   TVH- due to fibroids & bleeding    Current Outpatient Medications  Medication Sig Dispense Refill  . amLODipine (NORVASC) 5 MG tablet Take 1 tablet by mouth daily.    Marland Kitchen aspirin EC 81 MG tablet Take 81 mg by mouth daily.    . Cholecalciferol (VITAMIN D3) 250 MCG (10000 UT) capsule Take 50,000 Units by mouth once a week. Take 2 tablets daily Monday thru Friday once per month     . Ibuprofen (ADVIL PO) Take by mouth as needed.     No current facility-administered medications for this visit.    Family History  Problem Relation Age of Onset  . Hypertension Mother   . Depression Mother   . Hypertension Father   . Hyperlipidemia Father   .  Stroke Father   . Endometriosis Sister   . Diabetes Paternal Uncle   . Cancer Maternal Grandmother        stomach  . Hypertension Maternal Grandmother   . Hypertension Maternal Grandfather   . Diabetes Paternal Grandmother   . Hypertension Paternal Grandmother   . Hypertension Paternal Grandfather   . Stroke Paternal Grandfather     Review of Systems  All other systems reviewed and are negative.   Exam:   BP 128/80 (BP Location: Left Arm, Patient Position: Sitting, Cuff Size: Normal)   Pulse 68   Temp (!) 97.5 F (36.4 C) (Temporal)   Resp 12   Ht 5\' 4"  (1.626 m)   Wt 188 lb 12.8 oz (85.6 kg)   LMP 12/13/2007   BMI 32.41 kg/m   Height: 5\' 4"  (162.6 cm)  Ht Readings from Last 3 Encounters:  09/15/19 5\' 4"  (1.626 m)  04/30/19 5\' 3"  (1.6 m)  04/15/19 5' 3.5" (1.613 m)    General appearance: alert, cooperative and appears stated age Head: Normocephalic, without obvious abnormality, atraumatic Neck: no adenopathy, supple, symmetrical, trachea midline and thyroid normal to inspection and palpation Lungs: clear to auscultation bilaterally Breasts: normal appearance, no masses or tenderness Heart: regular rate and rhythm Abdomen: soft, non-tender; bowel sounds normal;  no masses,  no organomegaly Extremities: extremities normal, atraumatic, no cyanosis or edema Skin: Skin color, texture, turgor normal. No rashes or lesions Lymph nodes: Cervical, supraclavicular, and axillary nodes normal. No abnormal inguinal nodes palpated Neurologic: Grossly normal   Pelvic: External genitalia:  no lesions              Urethra:  normal appearing urethra with no masses, tenderness or lesions              Bartholins and Skenes: normal                 Vagina: normal appearing vagina with normal color and discharge, no lesions              Cervix: absent              Pap taken: No. Bimanual Exam:  Uterus:  uterus absent              Adnexa: normal adnexa and no mass, fullness, tenderness                Rectovaginal: Confirms               Anus:  normal sphincter tone, no lesions  Chaperone, Terence Lux, CMA, was present for exam.  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  Order for cologuard.   pap smear not indicated Declines shingles vaccine Lab work obtained with Mina Marble last year Tdap was updated in the ER in September Return annually or prn

## 2019-09-19 DIAGNOSIS — R5383 Other fatigue: Secondary | ICD-10-CM | POA: Diagnosis not present

## 2019-09-19 DIAGNOSIS — Z03818 Encounter for observation for suspected exposure to other biological agents ruled out: Secondary | ICD-10-CM | POA: Diagnosis not present

## 2019-09-19 DIAGNOSIS — R519 Headache, unspecified: Secondary | ICD-10-CM | POA: Diagnosis not present

## 2019-10-07 ENCOUNTER — Ambulatory Visit: Payer: BC Managed Care – PPO

## 2019-10-08 ENCOUNTER — Ambulatory Visit: Payer: BC Managed Care – PPO | Attending: Internal Medicine

## 2019-10-08 DIAGNOSIS — Z23 Encounter for immunization: Secondary | ICD-10-CM

## 2019-10-08 NOTE — Progress Notes (Signed)
   Covid-19 Vaccination Clinic  Name:  Traci Caldwell    MRN: [id number] DOB: January 19, 1966  10/08/2019  Traci Caldwell was observed post Covid-19 immunization for 15 minutes without incident. She was provided with Vaccine Information Sheet and instruction to access the V-Safe system.   Traci Caldwell was instructed to call 911 with any severe reactions post vaccine: Marland Kitchen Difficulty breathing  . Swelling of face and throat  . A fast heartbeat  . A bad rash all over body  . Dizziness and weakness   Immunizations Administered    Name Date Dose VIS Date Route   Pfizer COVID-19 Vaccine 10/08/2019  2:01 PM 0.3 mL 06/24/2019 Intramuscular   Manufacturer: [place]   Lot: [id number]   [place]: [id number]

## 2019-10-10 ENCOUNTER — Ambulatory Visit: Payer: BC Managed Care – PPO

## 2019-10-10 DIAGNOSIS — Z1212 Encounter for screening for malignant neoplasm of rectum: Secondary | ICD-10-CM | POA: Diagnosis not present

## 2019-10-10 DIAGNOSIS — Z1211 Encounter for screening for malignant neoplasm of colon: Secondary | ICD-10-CM | POA: Diagnosis not present

## 2019-10-27 ENCOUNTER — Other Ambulatory Visit: Payer: Self-pay

## 2019-10-27 ENCOUNTER — Telehealth: Payer: Self-pay

## 2019-10-27 DIAGNOSIS — Z1211 Encounter for screening for malignant neoplasm of colon: Secondary | ICD-10-CM

## 2019-10-27 LAB — COLOGUARD

## 2019-10-27 NOTE — Telephone Encounter (Signed)
Spoke with patient and notified of negative cologuard.

## 2019-11-01 ENCOUNTER — Ambulatory Visit: Payer: BC Managed Care – PPO | Attending: Internal Medicine

## 2019-11-01 DIAGNOSIS — Z23 Encounter for immunization: Secondary | ICD-10-CM

## 2019-11-01 NOTE — Progress Notes (Signed)
   Covid-19 Vaccination Clinic  Name:  MELIZA BIERCE    MRN: [id number] DOB: 04-06-1966  11/01/2019  Ms. Segura was observed post Covid-19 immunization for 15 minutes without incident. She was provided with Vaccine Information Sheet and instruction to access the V-Safe system.   Ms. Zasada was instructed to call 911 with any severe reactions post vaccine: Marland Kitchen Difficulty breathing  . Swelling of face and throat  . A fast heartbeat  . A bad rash all over body  . Dizziness and weakness   Immunizations Administered    Name Date Dose VIS Date Route   Pfizer COVID-19 Vaccine 11/01/2019  1:47 PM 0.3 mL 09/07/2018 Intramuscular   Manufacturer: [place]   Lot: [id number]   [place]: [id number]

## 2019-12-05 DIAGNOSIS — Z1331 Encounter for screening for depression: Secondary | ICD-10-CM | POA: Diagnosis not present

## 2019-12-05 DIAGNOSIS — E7800 Pure hypercholesterolemia, unspecified: Secondary | ICD-10-CM | POA: Diagnosis not present

## 2019-12-05 DIAGNOSIS — Z6832 Body mass index (BMI) 32.0-32.9, adult: Secondary | ICD-10-CM | POA: Diagnosis not present

## 2019-12-05 DIAGNOSIS — I10 Essential (primary) hypertension: Secondary | ICD-10-CM | POA: Diagnosis not present

## 2019-12-05 DIAGNOSIS — E559 Vitamin D deficiency, unspecified: Secondary | ICD-10-CM | POA: Diagnosis not present

## 2020-01-31 ENCOUNTER — Other Ambulatory Visit: Payer: Self-pay | Admitting: Obstetrics & Gynecology

## 2020-01-31 DIAGNOSIS — Z1231 Encounter for screening mammogram for malignant neoplasm of breast: Secondary | ICD-10-CM

## 2020-03-02 DIAGNOSIS — Z20822 Contact with and (suspected) exposure to covid-19: Secondary | ICD-10-CM | POA: Diagnosis not present

## 2020-04-21 ENCOUNTER — Encounter (INDEPENDENT_AMBULATORY_CARE_PROVIDER_SITE_OTHER): Payer: Self-pay

## 2020-05-18 DIAGNOSIS — N3001 Acute cystitis with hematuria: Secondary | ICD-10-CM | POA: Diagnosis not present

## 2020-06-11 ENCOUNTER — Ambulatory Visit
Admission: RE | Admit: 2020-06-11 | Discharge: 2020-06-11 | Disposition: A | Discharge status: Home / Self Care | Payer: BC Managed Care – PPO | Source: Ambulatory Visit | Attending: Obstetrics & Gynecology | Admitting: Obstetrics & Gynecology

## 2020-06-11 ENCOUNTER — Other Ambulatory Visit: Payer: Self-pay

## 2020-06-11 DIAGNOSIS — E7800 Pure hypercholesterolemia, unspecified: Secondary | ICD-10-CM | POA: Diagnosis not present

## 2020-06-11 DIAGNOSIS — E559 Vitamin D deficiency, unspecified: Secondary | ICD-10-CM | POA: Diagnosis not present

## 2020-06-11 DIAGNOSIS — I10 Essential (primary) hypertension: Secondary | ICD-10-CM | POA: Diagnosis not present

## 2020-06-11 DIAGNOSIS — Z1231 Encounter for screening mammogram for malignant neoplasm of breast: Secondary | ICD-10-CM

## 2020-06-11 DIAGNOSIS — R300 Dysuria: Secondary | ICD-10-CM | POA: Diagnosis not present

## 2020-06-17 DIAGNOSIS — Z20822 Contact with and (suspected) exposure to covid-19: Secondary | ICD-10-CM | POA: Diagnosis not present

## 2020-06-17 DIAGNOSIS — J069 Acute upper respiratory infection, unspecified: Secondary | ICD-10-CM | POA: Diagnosis not present

## 2020-07-10 DIAGNOSIS — I10 Essential (primary) hypertension: Secondary | ICD-10-CM | POA: Diagnosis not present

## 2020-07-10 DIAGNOSIS — E785 Hyperlipidemia, unspecified: Secondary | ICD-10-CM | POA: Diagnosis not present

## 2020-07-10 DIAGNOSIS — Z79899 Other long term (current) drug therapy: Secondary | ICD-10-CM | POA: Diagnosis not present

## 2020-10-29 DIAGNOSIS — I10 Essential (primary) hypertension: Secondary | ICD-10-CM | POA: Insufficient documentation

## 2020-12-14 ENCOUNTER — Ambulatory Visit: Payer: BC Managed Care – PPO

## 2021-01-24 ENCOUNTER — Other Ambulatory Visit (HOSPITAL_COMMUNITY)
Admission: RE | Admit: 2021-01-24 | Discharge: 2021-01-24 | Disposition: A | Discharge status: Home / Self Care | Payer: BC Managed Care – PPO | Source: Ambulatory Visit | Attending: Obstetrics & Gynecology | Admitting: Obstetrics & Gynecology

## 2021-01-24 ENCOUNTER — Ambulatory Visit (INDEPENDENT_AMBULATORY_CARE_PROVIDER_SITE_OTHER): Payer: BC Managed Care – PPO | Admitting: Obstetrics & Gynecology

## 2021-01-24 ENCOUNTER — Other Ambulatory Visit: Payer: Self-pay

## 2021-01-24 ENCOUNTER — Encounter (HOSPITAL_BASED_OUTPATIENT_CLINIC_OR_DEPARTMENT_OTHER): Payer: Self-pay | Admitting: Obstetrics & Gynecology

## 2021-01-24 VITALS — BP 164/99 | HR 63 | Ht 63.5 in | Wt 178.6 lb

## 2021-01-24 DIAGNOSIS — Z124 Encounter for screening for malignant neoplasm of cervix: Secondary | ICD-10-CM | POA: Insufficient documentation

## 2021-01-24 DIAGNOSIS — Z90710 Acquired absence of both cervix and uterus: Secondary | ICD-10-CM | POA: Diagnosis not present

## 2021-01-24 DIAGNOSIS — Z780 Asymptomatic menopausal state: Secondary | ICD-10-CM

## 2021-01-24 DIAGNOSIS — Z01419 Encounter for gynecological examination (general) (routine) without abnormal findings: Secondary | ICD-10-CM

## 2021-01-24 DIAGNOSIS — R030 Elevated blood-pressure reading, without diagnosis of hypertension: Secondary | ICD-10-CM | POA: Diagnosis not present

## 2021-01-24 NOTE — Progress Notes (Signed)
55 y.o. G82P2002 Married White or Caucasian female here for annual exam.  Doing well.  Still not smoking.  Denies vaginal bleeding.  Pt is aware BP elevated.  Reports this is not typically the case.  Does not have BP cuff at home but can have checked at work.  Denies headache or visual changes or chest pain.  Patient's last menstrual period was 12/13/2007.          Sexually active: Yes.    The current method of family planning is status post hysterectomy.    Smoker:  no, former smoker  Health Maintenance: Pap:  09/01/16 neg History of abnormal Pap:  h/o CIN 1 MMG:  06/11/20 neg Cologuard:  10/10/19 BMD:   not indicated TDaP:  04/12/19 Shingrix:   planning to have this year as husband had shingles this year Screening Labs: does with PCP   reports that she quit smoking about 10 years ago. Her smoking use included cigarettes. She has a 20.00 pack-year smoking history. She has never used smokeless tobacco. She reports that she does not drink alcohol and does not use drugs.  Past Medical History:  Diagnosis Date   CIN I (cervical intraepithelial neoplasia I) 08/2004   resolved spontaneously    Past Surgical History:  Procedure Laterality Date   COLPOSCOPY  08/2004   CIN I   TUBAL LIGATION  1996   VAGINAL HYSTERECTOMY  12/2007   TVH- due to fibroids & bleeding    Current Outpatient Medications  Medication Sig Dispense Refill   amLODipine (NORVASC) 5 MG tablet Take 1 tablet by mouth daily.     aspirin EC 81 MG tablet Take 81 mg by mouth daily.     Cholecalciferol (VITAMIN D3) 250 MCG (10000 UT) capsule Take 50,000 Units by mouth once a week. Take 2 tablets daily Monday thru Friday once per month      ezetimibe (ZETIA) 10 MG tablet Take 10 mg by mouth daily.     Ibuprofen (ADVIL PO) Take by mouth as needed.     Omega-3 1000 MG CAPS Take by mouth daily.     No current facility-administered medications for this visit.    Family History  Problem Relation Age of Onset   Hypertension  Mother    Depression Mother    Hypertension Father    Hyperlipidemia Father    Stroke Father    Endometriosis Sister    Diabetes Paternal Uncle    Cancer Maternal Grandmother        stomach   Hypertension Maternal Grandmother    Hypertension Maternal Grandfather    Diabetes Paternal Grandmother    Hypertension Paternal Grandmother    Hypertension Paternal Grandfather    Stroke Paternal Grandfather     Review of Systems  All other systems reviewed and are negative.  Exam:   BP (!) 164/99 (BP Location: Right Arm, Patient Position: Sitting, Cuff Size: Large)   Pulse 63   Ht 5' 3.5" (1.613 m) Comment: reported  Wt 178 lb 9.6 oz (81 kg)   LMP 12/13/2007   BMI 31.14 kg/m   Height: 5' 3.5" (161.3 cm) (reported)  General appearance: alert, cooperative and appears stated age Head: Normocephalic, without obvious abnormality, atraumatic Neck: no adenopathy, supple, symmetrical, trachea midline and thyroid normal to inspection and palpation Lungs: clear to auscultation bilaterally Breasts: normal appearance, no masses or tenderness Heart: regular rate and rhythm Abdomen: soft, non-tender; bowel sounds normal; no masses,  no organomegaly Extremities: extremities normal, atraumatic, no cyanosis or  edema Skin: Skin color, texture, turgor normal. No rashes or lesions Lymph nodes: Cervical, supraclavicular, and axillary nodes normal. No abnormal inguinal nodes palpated Neurologic: Grossly normal   Pelvic: External genitalia:  no lesions              Urethra:  normal appearing urethra with no masses, tenderness or lesions              Bartholins and Skenes: normal                 Vagina: normal appearing vagina with normal color and no discharge, no lesions              Cervix: absent              Pap taken: Yes.   Bimanual Exam:  Uterus:  uterus absent              Adnexa: no mass, fullness, tenderness               Rectovaginal: Confirms               Anus:  normal sphincter tone,  no lesions  Chaperone, Octaviano Batty, CMA, was present for exam.  Assessment/Plan: 1. Well woman exam with routine gynecological exam - pap smear obtained today - MMG 06/11/2020 negative - cologuard 10/10/2019.  Repeat 3 years. - plan BMD closer to age 50 - vaccines reviewed - lab work done with Dr. Doyle Askew  2. Postmenopausal - no HRT  3. H/O: hysterectomy  4. Elevated blood pressure reading - pt is going to monitor this and let PCP know if really elevated outside of office today

## 2021-01-27 DIAGNOSIS — Z90710 Acquired absence of both cervix and uterus: Secondary | ICD-10-CM | POA: Insufficient documentation

## 2021-01-27 DIAGNOSIS — Z780 Asymptomatic menopausal state: Secondary | ICD-10-CM | POA: Insufficient documentation

## 2021-01-29 LAB — CYTOLOGY - PAP
Comment: NEGATIVE
Diagnosis: NEGATIVE
High risk HPV: NEGATIVE

## 2021-05-14 ENCOUNTER — Encounter (HOSPITAL_BASED_OUTPATIENT_CLINIC_OR_DEPARTMENT_OTHER): Payer: Self-pay

## 2021-06-28 ENCOUNTER — Other Ambulatory Visit: Payer: Self-pay

## 2021-06-28 ENCOUNTER — Ambulatory Visit
Admission: EM | Admit: 2021-06-28 | Discharge: 2021-06-28 | Disposition: A | Discharge status: Home / Self Care | Payer: BC Managed Care – PPO | Attending: Internal Medicine | Admitting: Internal Medicine

## 2021-06-28 DIAGNOSIS — J069 Acute upper respiratory infection, unspecified: Secondary | ICD-10-CM | POA: Insufficient documentation

## 2021-06-28 DIAGNOSIS — J029 Acute pharyngitis, unspecified: Secondary | ICD-10-CM | POA: Insufficient documentation

## 2021-06-28 LAB — POCT RAPID STREP A (OFFICE): Rapid Strep A Screen: NEGATIVE

## 2021-06-28 MED ORDER — CETIRIZINE HCL 10 MG PO TABS: 10.0000 mg | ORAL_TABLET | Freq: Every day | ORAL | 0 refills | Status: DC

## 2021-06-28 MED ORDER — BENZONATATE 100 MG PO CAPS: 100.0000 mg | ORAL_CAPSULE | Freq: Three times a day (TID) | ORAL | 0 refills | Status: DC | PRN

## 2021-06-28 MED ORDER — FLUTICASONE PROPIONATE 50 MCG/ACT NA SUSP: 1.0000 | Freq: Every day | NASAL | 0 refills | Status: DC

## 2021-06-28 NOTE — ED Triage Notes (Signed)
Onset yesterday of cough, HA, fatigue, congestion, body aches, chills and sore throat. Has been using cough drops and tylenol cold without relief. No v/d. Pt is covid and flu vaccinated.

## 2021-06-28 NOTE — Discharge Instructions (Signed)
It appears that you have a viral upper respiratory infection that should resolve in the next few days with symptomatic treatment.  You have been prescribed 3 medications to help alleviate your symptoms.  Please avoid Coricidin HBP over-the-counter if you are going to take Zyrtec, which has been prescribed for you.  Your COVID-19 and flu test is pending.  Rapid strep was negative.  Throat culture is pending.  We will call you if they are positive.

## 2021-06-28 NOTE — ED Provider Notes (Signed)
[facility]    CSN: [id number] Arrival date & time: 06/28/21  0851      History   Chief Complaint Chief Complaint  Patient presents with   Cough   Generalized Body Aches    HPI Traci Caldwell is a 55 y.o. female.   Patient presents with nonproductive cough, headache, fatigue, nasal congestion, body aches, chills, sore throat that started yesterday.  Patient denies chest pain, shortness of breath, ear pain, nausea, vomiting, diarrhea, abdominal pain.  Denies any known fevers or sick contacts.  Patient has been using cough drops and taking Tylenol Cold and flu over-the-counter with minimal improvement in symptoms.   Cough  Past Medical History:  Diagnosis Date   CIN I (cervical intraepithelial neoplasia I) 08/2004   resolved spontaneously    Patient Active Problem List   Diagnosis Date Noted   Postmenopausal 01/27/2021   H/O: hysterectomy 01/27/2021   Obesity (BMI 30.0-34.9) 11/30/2018   Elevated LDL cholesterol level 11/30/2018   Elevated blood pressure reading 11/30/2018   Healthcare maintenance 11/30/2018   White coat syndrome without diagnosis of hypertension 09/01/2016   Breast density 09/01/2016   Former smoker 09/01/2016    Past Surgical History:  Procedure Laterality Date   COLPOSCOPY  08/2004   CIN I   TUBAL LIGATION  1996   VAGINAL HYSTERECTOMY  12/2007   TVH- due to fibroids & bleeding    OB History     Gravida  2   Para  2   Term  2   Preterm      AB      Living  2      SAB      IAB      Ectopic      Multiple      Live Births               Home Medications    Prior to Admission medications   Medication Sig Start Date End Date Taking? Authorizing Provider  benzonatate (TESSALON) 100 MG capsule Take 1 capsule (100 mg total) by mouth every 8 (eight) hours as needed for cough. 06/28/21  Yes Binyomin Brann, Michele Rockers, FNP  cetirizine (ZYRTEC) 10 MG tablet Take 1 tablet (10 mg total) by mouth daily for 10 days. 06/28/21  07/08/21 Yes Rawad Bochicchio, Michele Rockers, FNP  fluticasone (FLONASE) 50 MCG/ACT nasal spray Place 1 spray into both nostrils daily for 3 days. 06/28/21 07/01/21 Yes Nial Hawe, Hildred Alamin E, FNP  amLODipine (NORVASC) 5 MG tablet Take 1 tablet by mouth daily. 06/06/19   [provider]  aspirin EC 81 MG tablet Take 81 mg by mouth daily.    [provider]  Cholecalciferol (VITAMIN D3) 250 MCG (10000 UT) capsule Take 50,000 Units by mouth once a week. Take 2 tablets daily Monday thru Friday once per month     [provider]  ezetimibe (ZETIA) 10 MG tablet Take 10 mg by mouth daily.    [provider]  Ibuprofen (ADVIL PO) Take by mouth as needed.    [provider]  Omega-3 1000 MG CAPS Take by mouth daily.    [provider]    Family History Family History  Problem Relation Age of Onset   Hypertension Mother    Depression Mother    Hypertension Father    Hyperlipidemia Father    Stroke Father    Endometriosis Sister    Diabetes Paternal Uncle    Cancer Maternal Grandmother  stomach   Hypertension Maternal Grandmother    Hypertension Maternal Grandfather    Diabetes Paternal Grandmother    Hypertension Paternal Grandmother    Hypertension Paternal Grandfather    Stroke Paternal Grandfather     Social History Social History   Tobacco Use   Smoking status: Former    Packs/day: 1.00    Years: 20.00    Pack years: 20.00    Types: Cigarettes    Quit date: 07/14/2010    Years since quitting: 10.9   Smokeless tobacco: Never  Vaping Use   Vaping Use: Never used  Substance Use Topics   Alcohol use: No   Drug use: No     Allergies   Lisinopril and Penicillins   Review of Systems Review of Systems Per HPI  Physical Exam Triage Vital Signs ED Triage Vitals  Enc Vitals Group     BP 06/28/21 0903 (!) 147/89     Pulse Rate 06/28/21 0903 70     Resp 06/28/21 0903 18     Temp 06/28/21 0903 98.3 F (36.8 C)     Temp Source 06/28/21  0903 Oral     SpO2 06/28/21 0903 98 %     Weight --      Height --      Head Circumference --      Peak Flow --      Pain Score 06/28/21 0905 6     Pain Loc --      Pain Edu? --      Excl. in [place]? --    No data found.  Updated Vital Signs BP (!) 147/89 (BP Location: Left Arm)    Pulse 70    Temp 98.3 F (36.8 C) (Oral)    Resp 18    LMP 12/13/2007    SpO2 98%   Visual Acuity Right Eye Distance:   Left Eye Distance:   Bilateral Distance:    Right Eye Near:   Left Eye Near:    Bilateral Near:     Physical Exam Constitutional:      General: She is not in acute distress.    Appearance: Normal appearance. She is not toxic-appearing or diaphoretic.  HENT:     Head: Normocephalic and atraumatic.     Right Ear: Tympanic membrane and ear canal normal.     Left Ear: Tympanic membrane and ear canal normal.     Nose: Congestion present.     Mouth/Throat:     Mouth: Mucous membranes are moist.     Pharynx: Posterior oropharyngeal erythema present.  Eyes:     Extraocular Movements: Extraocular movements intact.     Conjunctiva/sclera: Conjunctivae normal.     Pupils: Pupils are equal, round, and reactive to light.  Cardiovascular:     Rate and Rhythm: Normal rate and regular rhythm.     Pulses: Normal pulses.     Heart sounds: Normal heart sounds.  Pulmonary:     Effort: Pulmonary effort is normal. No respiratory distress.     Breath sounds: Normal breath sounds. No stridor. No wheezing, rhonchi or rales.  Abdominal:     General: Abdomen is flat. Bowel sounds are normal.     Palpations: Abdomen is soft.  Musculoskeletal:        General: Normal range of motion.     Cervical back: Normal range of motion.  Skin:    General: Skin is warm and dry.  Neurological:     General: No focal deficit present.  Mental Status: She is alert and oriented to person, place, and time. Mental status is at baseline.  Psychiatric:        Mood and Affect: Mood normal.        Behavior:  Behavior normal.     UC Treatments / Results  Labs (all labs ordered are listed, but only abnormal results are displayed) Labs Reviewed  CULTURE, GROUP A STREP ([place])  COVID-19, FLU A+B NAA  POCT RAPID STREP A (OFFICE)    EKG   Radiology No results found.  Procedures Procedures (including critical care time)  Medications Ordered in UC Medications - No data to display  Initial Impression / Assessment and Plan / UC Course  I have reviewed the triage vital signs and the nursing notes.  Pertinent labs & imaging results that were available during my care of the patient were reviewed by me and considered in my medical decision making (see chart for details).     Patient presents with symptoms likely from a viral upper respiratory infection. Differential includes bacterial pneumonia, sinusitis, allergic rhinitis, Covid 19, flu. Do not suspect underlying cardiopulmonary process. Symptoms seem unlikely related to ACS, CHF or COPD exacerbations, pneumonia, pneumothorax. Patient is nontoxic appearing and not in need of emergent medical intervention.  Rapid strep was negative.  Throat culture, COVID-19, flu test pending.  Recommended symptom control with over the counter medications that are safe with high blood pressure.  Patient offered prescriptions.  Return if symptoms fail to improve in 1-2 weeks or you develop shortness of breath, chest pain, severe headache. Patient states understanding and is agreeable.  Discharged with PCP followup.  Final Clinical Impressions(s) / UC Diagnoses   Final diagnoses:  Viral upper respiratory tract infection with cough  Sore throat     Discharge Instructions      It appears that you have a viral upper respiratory infection that should resolve in the next few days with symptomatic treatment.  You have been prescribed 3 medications to help alleviate your symptoms.  Please avoid Coricidin HBP over-the-counter if you are going to take Zyrtec,  which has been prescribed for you.  Your COVID-19 and flu test is pending.  Rapid strep was negative.  Throat culture is pending.  We will call you if they are positive.    ED Prescriptions     Medication Sig Dispense Auth. Provider   cetirizine (ZYRTEC) 10 MG tablet Take 1 tablet (10 mg total) by mouth daily for 10 days. 30 tablet Paauilo, Dudley E, [place]   fluticasone [facility]) 50 MCG/ACT nasal spray Place 1 spray into both nostrils daily for 3 days. 16 g Kady Toothaker, Hildred Alamin E, [place]   benzonatate (TESSALON) 100 MG capsule Take 1 capsule (100 mg total) by mouth every 8 (eight) hours as needed for cough. 21 capsule Drummond, Michele Rockers, [place]      PDMP not reviewed this encounter.   Teodora Medici, [place] 06/28/21 [id number]

## 2021-06-29 LAB — COVID-19, FLU A+B NAA
Influenza A, NAA: NOT DETECTED
Influenza B, NAA: NOT DETECTED
SARS-CoV-2, NAA: DETECTED — AB

## 2021-07-01 LAB — CULTURE, GROUP A STREP (THRC)

## 2021-07-17 ENCOUNTER — Other Ambulatory Visit: Payer: Self-pay | Admitting: Obstetrics & Gynecology

## 2021-07-17 DIAGNOSIS — Z1231 Encounter for screening mammogram for malignant neoplasm of breast: Secondary | ICD-10-CM

## 2021-08-05 ENCOUNTER — Ambulatory Visit
Admission: RE | Admit: 2021-08-05 | Discharge: 2021-08-05 | Disposition: A | Discharge status: Home / Self Care | Payer: BC Managed Care – PPO | Source: Ambulatory Visit

## 2021-08-05 DIAGNOSIS — Z1231 Encounter for screening mammogram for malignant neoplasm of breast: Secondary | ICD-10-CM

## 2021-11-05 ENCOUNTER — Other Ambulatory Visit (HOSPITAL_BASED_OUTPATIENT_CLINIC_OR_DEPARTMENT_OTHER): Payer: Self-pay | Admitting: *Deleted

## 2021-11-05 ENCOUNTER — Encounter (HOSPITAL_BASED_OUTPATIENT_CLINIC_OR_DEPARTMENT_OTHER): Payer: Self-pay | Admitting: Obstetrics & Gynecology

## 2022-02-04 ENCOUNTER — Ambulatory Visit (HOSPITAL_BASED_OUTPATIENT_CLINIC_OR_DEPARTMENT_OTHER): Payer: BC Managed Care – PPO | Admitting: Obstetrics & Gynecology

## 2022-02-26 IMAGING — MG DIGITAL SCREENING BILAT W/ TOMO W/ CAD
8 series · 8 of 24 positions shown · non-contrast
Comparison: Previous exam(s).

CLINICAL DATA: Screening.

EXAM:
DIGITAL SCREENING BILATERAL MAMMOGRAM WITH TOMO AND CAD

[R MLO synth-2D]
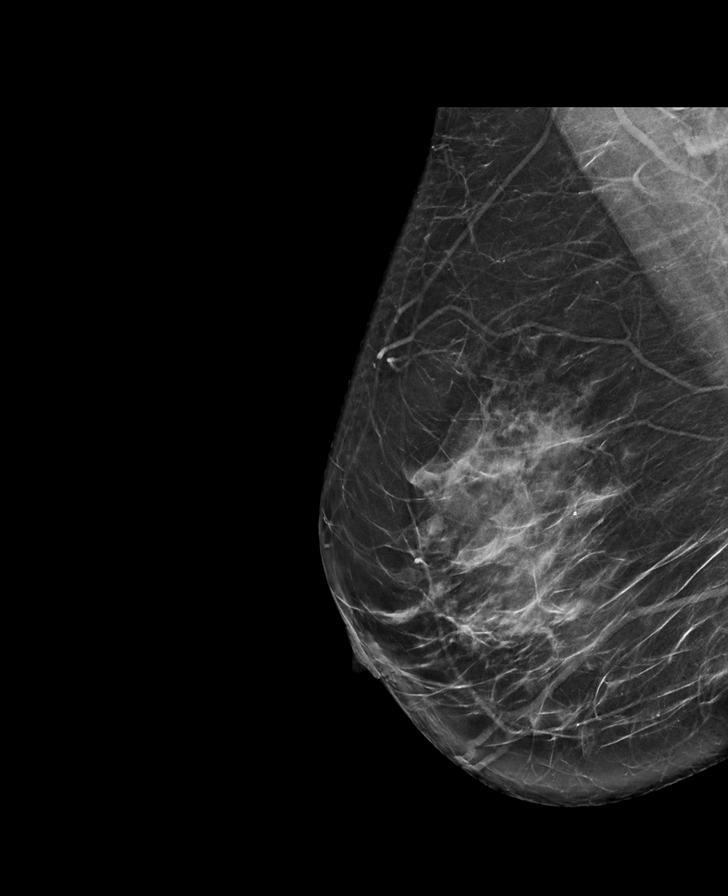

[L CC synth-2D]
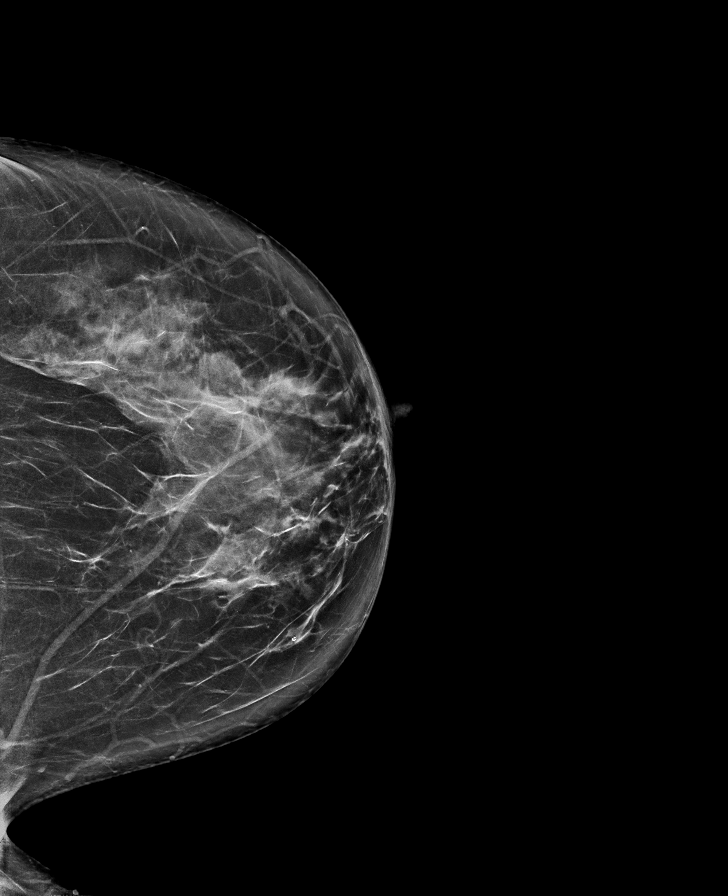

[L MLO synth-2D]
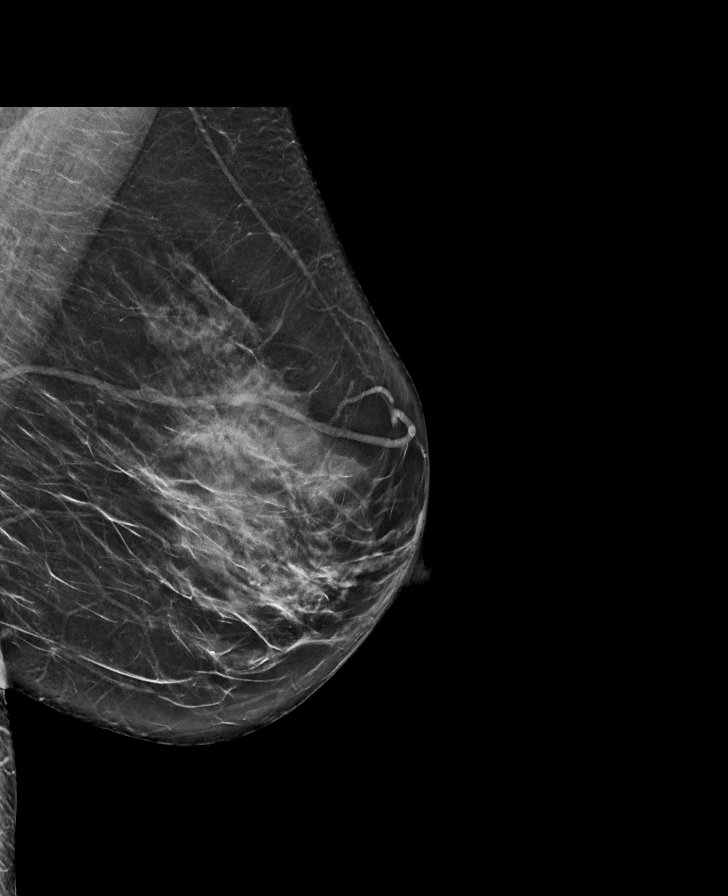

[R CC synth-2D]
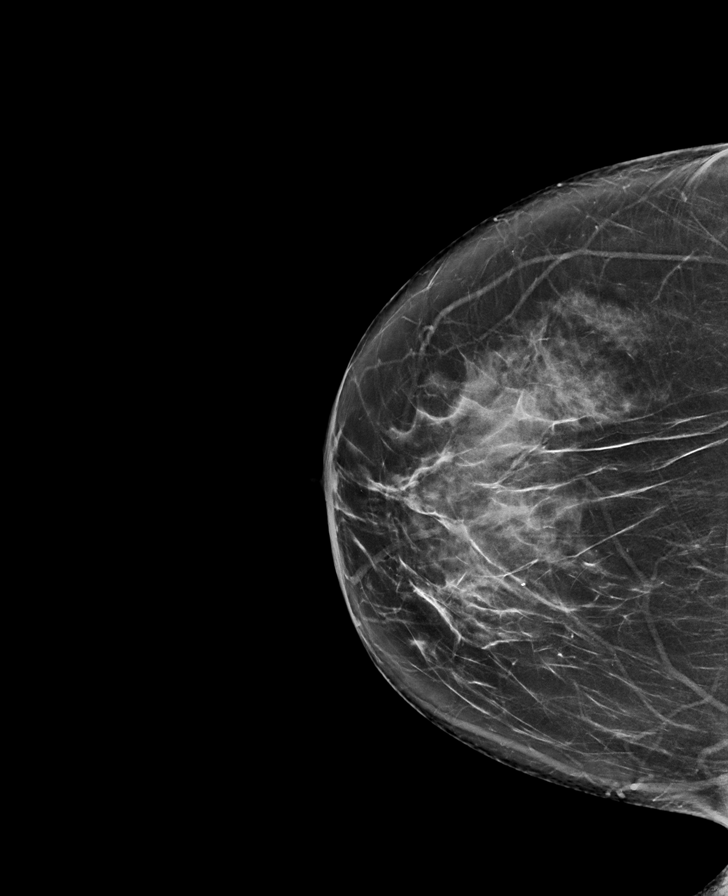

[L MLO tomo · tomo slice 39/76.0]
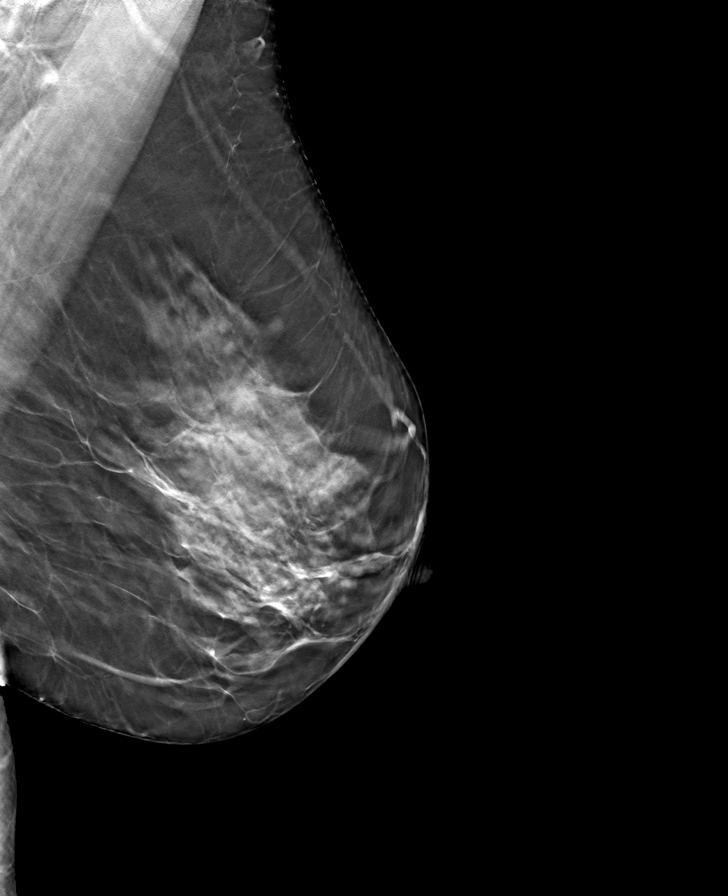

[L CC tomo · tomo slice 38/75.0]
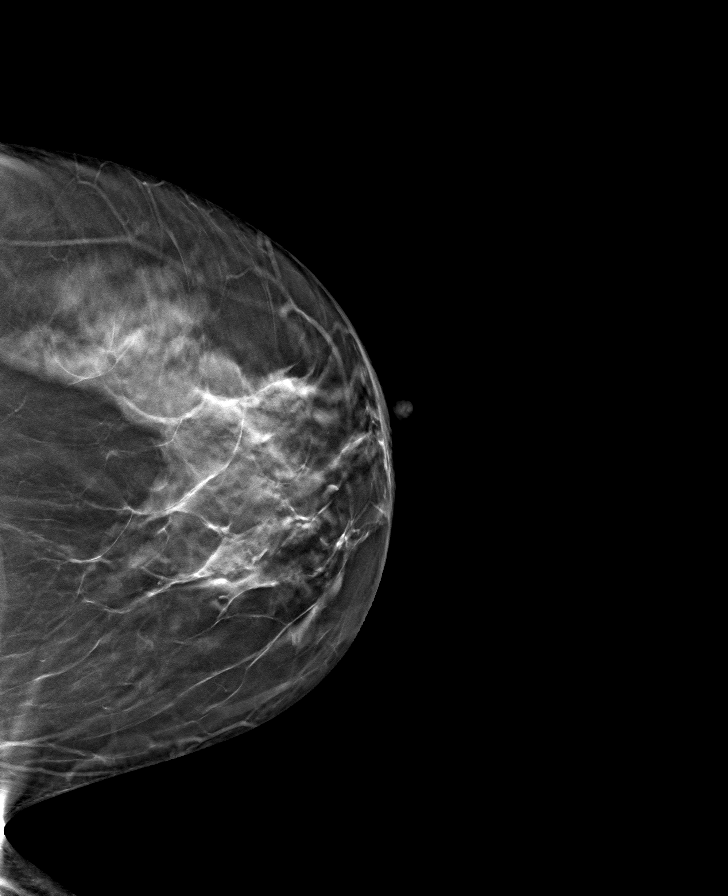

[R MLO tomo · tomo slice 40/79.0]
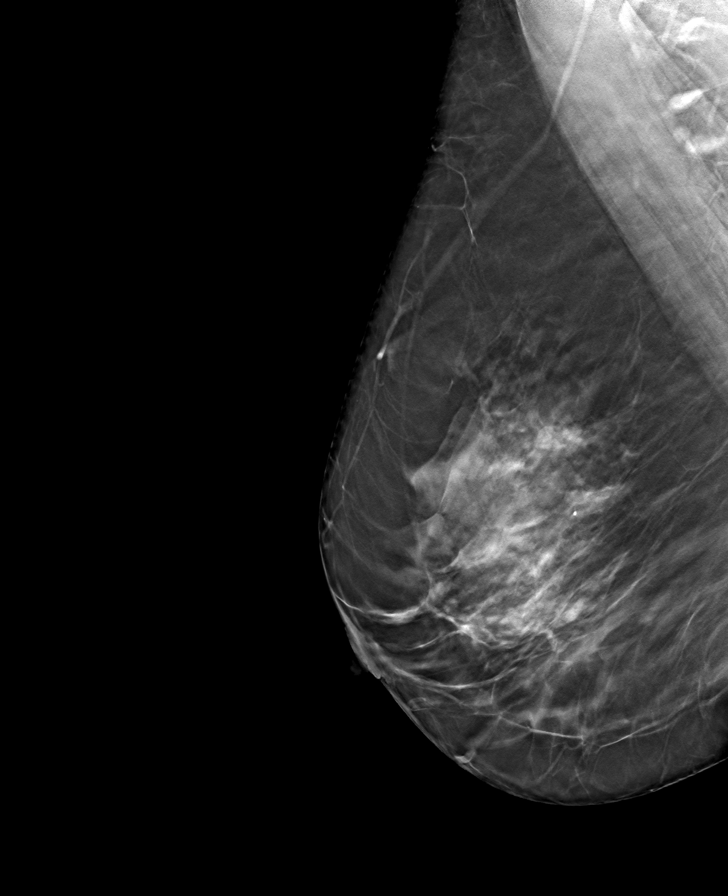

[R CC tomo · tomo slice 39/78.0]
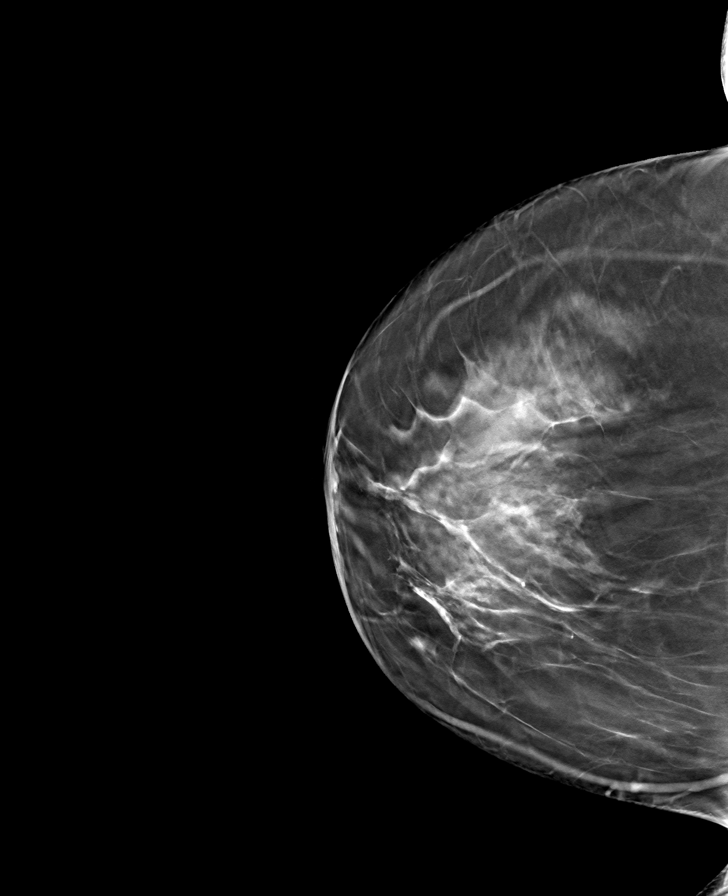

[8 of 24 positions shown; findings below may reference images not displayed]

ACR Breast Density Category c: The breast tissue is heterogeneously
dense, which may obscure small masses.
FINDINGS: There are no findings suspicious for malignancy. Images were
processed with CAD.
IMPRESSION: No mammographic evidence of malignancy. A result letter of this
screening mammogram will be mailed directly to the patient.

RECOMMENDATION:
Screening mammogram in one year. (Code:[id number]-[id number])

BI-RADS CATEGORY  1: Negative.

## 2022-04-18 ENCOUNTER — Encounter (HOSPITAL_BASED_OUTPATIENT_CLINIC_OR_DEPARTMENT_OTHER): Payer: Self-pay | Admitting: Obstetrics & Gynecology

## 2022-04-18 ENCOUNTER — Ambulatory Visit (INDEPENDENT_AMBULATORY_CARE_PROVIDER_SITE_OTHER): Payer: BC Managed Care – PPO | Admitting: Obstetrics & Gynecology

## 2022-04-18 VITALS — BP 158/80 | HR 66 | Ht 63.0 in | Wt 183.2 lb

## 2022-04-18 DIAGNOSIS — Z90710 Acquired absence of both cervix and uterus: Secondary | ICD-10-CM

## 2022-04-18 DIAGNOSIS — Z780 Asymptomatic menopausal state: Secondary | ICD-10-CM

## 2022-04-18 DIAGNOSIS — Z01419 Encounter for gynecological examination (general) (routine) without abnormal findings: Secondary | ICD-10-CM | POA: Diagnosis not present

## 2022-04-18 DIAGNOSIS — Z87891 Personal history of nicotine dependence: Secondary | ICD-10-CM | POA: Diagnosis not present

## 2022-04-18 DIAGNOSIS — R030 Elevated blood-pressure reading, without diagnosis of hypertension: Secondary | ICD-10-CM

## 2022-04-18 NOTE — Progress Notes (Signed)
56 y.o. G43P2002 Married White or Caucasian female here for annual exam.  Doing well.  Has new diagnosis of thyroid nodules.  Will have follow up 1 year.    Has osteoporosis diagnosed on BMD.  Had compression fracture due to lifting something really heavy.   Denies vaginal bleeding.    Patient's last menstrual period was 12/13/2007.          Sexually active: Yes.    The current method of family planning is status post hysterectomy.    Smoker:  no  Health Maintenance: Pap:  01/24/2021 Negative History of abnormal Pap:  h/o CIN1 MMG:  08/05/2021 Negative Colonoscopy:  cologuard 09/2019 Screening Labs: 10/2021.  Reviewed in [place]   reports that she quit smoking about 11 years ago. Her smoking use included cigarettes. She has a 20.00 pack-year smoking history. She has never used smokeless tobacco. She reports that she does not drink alcohol and does not use drugs.  Past Medical History:  Diagnosis Date   CIN I (cervical intraepithelial neoplasia I) 08/2004   resolved spontaneously    Past Surgical History:  Procedure Laterality Date   COLPOSCOPY  08/2004   CIN I   TUBAL LIGATION  1996   VAGINAL HYSTERECTOMY  12/2007   TVH- due to fibroids & bleeding    Current Outpatient Medications  Medication Sig Dispense Refill   amLODipine (NORVASC) 10 MG tablet Take 10 mg by mouth.     aspirin EC 81 MG tablet Take 81 mg by mouth daily.     Cholecalciferol (VITAMIN D3) 250 MCG (10000 UT) capsule Take 50,000 Units by mouth once a week. Take 2 tablets daily Monday thru Friday once per month      ezetimibe (ZETIA) 10 MG tablet Take 10 mg by mouth daily.     Ibuprofen (ADVIL PO) Take by mouth as needed.     Omega-3 1000 MG CAPS Take by mouth daily.     No current facility-administered medications for this visit.    Family History  Problem Relation Age of Onset   Hypertension Mother    Depression Mother    Hypertension Father    Hyperlipidemia Father    Stroke Father     Endometriosis Sister    Diabetes Paternal Uncle    Cancer Maternal Grandmother        stomach   Hypertension Maternal Grandmother    Hypertension Maternal Grandfather    Diabetes Paternal Grandmother    Hypertension Paternal Grandmother    Hypertension Paternal Grandfather    Stroke Paternal Grandfather     ROS: Constitutional: negative Genitourinary:negative  Exam:   BP (!) 158/80 (BP Location: Right Arm, Patient Position: Sitting, Cuff Size: Large)   Pulse 66   Ht '5\' 3"'$  (1.6 m) Comment: Reported  Wt 183 lb 3.2 oz (83.1 kg)   LMP 12/13/2007   BMI 32.45 kg/m   Height: '5\' 3"'$  (160 cm) (Reported)  General appearance: alert, cooperative and appears stated age Head: Normocephalic, without obvious abnormality, atraumatic Neck: no adenopathy, supple, symmetrical, trachea midline and thyroid normal to inspection and palpation Lungs: clear to auscultation bilaterally Breasts: normal appearance, no masses or tenderness Heart: regular rate and rhythm Abdomen: soft, non-tender; bowel sounds normal; no masses,  no organomegaly Extremities: extremities normal, atraumatic, no cyanosis or edema Skin: Skin color, texture, turgor normal. No rashes or lesions Lymph nodes: Cervical, supraclavicular, and axillary nodes normal. No abnormal inguinal nodes palpated Neurologic: Grossly normal   Pelvic: External genitalia:  no lesions  Urethra:  normal appearing urethra with no masses, tenderness or lesions              Bartholins and Skenes: normal                 Vagina: normal appearing vagina with normal color and no discharge, no lesions              Cervix: absent              Pap taken: No. Bimanual Exam:  Uterus:  uterus absent              Adnexa: no mass, fullness, tenderness               Rectovaginal: Confirms               Anus:  normal sphincter tone, no lesions  Chaperone, Octaviano Batty, CMA, was present for exam.  Assessment/Plan: 1. Well woman exam with routine  gynecological exam - Pap smear neg 2022 - Mammogram up to date - Colonoscopy declined but did do cologuard in 2021.  Due next year. - Bone mineral density done 11/2021 - lab work done done with PCP - vaccines reviewed/updated  2. H/O: hysterectomy  3. Postmenopausal - no HRT  4. Elevated blood pressure reading - on Norvasc  5. Former smoker  6.  Lipoma on face - will refer for second opinion this year

## 2022-04-22 ENCOUNTER — Other Ambulatory Visit (HOSPITAL_BASED_OUTPATIENT_CLINIC_OR_DEPARTMENT_OTHER): Payer: Self-pay | Admitting: Obstetrics & Gynecology

## 2022-04-22 DIAGNOSIS — D170 Benign lipomatous neoplasm of skin and subcutaneous tissue of head, face and neck: Secondary | ICD-10-CM

## 2022-04-23 ENCOUNTER — Encounter (HOSPITAL_BASED_OUTPATIENT_CLINIC_OR_DEPARTMENT_OTHER): Payer: Self-pay | Admitting: Obstetrics & Gynecology

## 2022-05-08 DIAGNOSIS — M810 Age-related osteoporosis without current pathological fracture: Secondary | ICD-10-CM | POA: Insufficient documentation

## 2022-05-08 DIAGNOSIS — E041 Nontoxic single thyroid nodule: Secondary | ICD-10-CM | POA: Insufficient documentation

## 2022-05-20 ENCOUNTER — Encounter (HOSPITAL_BASED_OUTPATIENT_CLINIC_OR_DEPARTMENT_OTHER): Payer: Self-pay | Admitting: Obstetrics & Gynecology

## 2022-05-22 ENCOUNTER — Encounter (HOSPITAL_BASED_OUTPATIENT_CLINIC_OR_DEPARTMENT_OTHER): Payer: Self-pay | Admitting: Obstetrics & Gynecology

## 2022-06-27 ENCOUNTER — Other Ambulatory Visit: Payer: Self-pay

## 2022-06-27 ENCOUNTER — Encounter (HOSPITAL_BASED_OUTPATIENT_CLINIC_OR_DEPARTMENT_OTHER): Payer: Self-pay | Admitting: Plastic Surgery

## 2022-06-27 ENCOUNTER — Encounter (HOSPITAL_BASED_OUTPATIENT_CLINIC_OR_DEPARTMENT_OTHER)
Admission: RE | Admit: 2022-06-27 | Discharge: 2022-06-27 | Disposition: A | Discharge status: Home / Self Care | Payer: BC Managed Care – PPO | Source: Ambulatory Visit | Attending: Plastic Surgery | Admitting: Plastic Surgery

## 2022-06-27 DIAGNOSIS — R030 Elevated blood-pressure reading, without diagnosis of hypertension: Secondary | ICD-10-CM | POA: Insufficient documentation

## 2022-06-27 DIAGNOSIS — Z01810 Encounter for preprocedural cardiovascular examination: Secondary | ICD-10-CM | POA: Insufficient documentation

## 2022-06-27 NOTE — Progress Notes (Signed)
       Patient Instructions  The night before surgery:  No food after midnight. ONLY clear liquids after midnight  The day of surgery (if you do NOT have diabetes):  Drink ONE (1) Pre-Surgery Clear Ensure as directed.   This drink was given to you during your hospital  pre-op appointment visit. The pre-op nurse will instruct you on the time to drink the  Pre-Surgery Ensure depending on your surgery time. Finish the drink at the designated time by the pre-op nurse.  Nothing else to drink after completing the  Pre-Surgery Clear Ensure.  The day of surgery (if you have diabetes): Drink ONE (1) Gatorade 2 (G2) as directed. This drink was given to you during your hospital  pre-op appointment visit.  The pre-op nurse will instruct you on the time to drink the   Gatorade 2 (G2) depending on your surgery time. Color of the Gatorade may vary. Red is not allowed. Nothing else to drink after completing the  Gatorade 2 (G2).         If you have questions, please contact your surgeon's office.  Patient given CHG soap with verbal and written instruction. Patient verbalized understanding.

## 2022-07-08 ENCOUNTER — Encounter (HOSPITAL_BASED_OUTPATIENT_CLINIC_OR_DEPARTMENT_OTHER): Payer: Self-pay | Admitting: Plastic Surgery

## 2022-07-08 ENCOUNTER — Ambulatory Visit (HOSPITAL_BASED_OUTPATIENT_CLINIC_OR_DEPARTMENT_OTHER): Payer: BC Managed Care – PPO | Admitting: Certified Registered"

## 2022-07-08 ENCOUNTER — Encounter (HOSPITAL_BASED_OUTPATIENT_CLINIC_OR_DEPARTMENT_OTHER)
Admission: RE | Disposition: A | Discharge status: Home / Self Care | Payer: Self-pay | Source: Home / Self Care | Attending: Plastic Surgery

## 2022-07-08 ENCOUNTER — Ambulatory Visit (HOSPITAL_BASED_OUTPATIENT_CLINIC_OR_DEPARTMENT_OTHER)
Admission: RE | Admit: 2022-07-08 | Discharge: 2022-07-08 | Disposition: A | Discharge status: Home / Self Care | Payer: BC Managed Care – PPO | Attending: Plastic Surgery | Admitting: Plastic Surgery

## 2022-07-08 ENCOUNTER — Other Ambulatory Visit: Payer: Self-pay

## 2022-07-08 DIAGNOSIS — D170 Benign lipomatous neoplasm of skin and subcutaneous tissue of head, face and neck: Secondary | ICD-10-CM | POA: Diagnosis not present

## 2022-07-08 DIAGNOSIS — K219 Gastro-esophageal reflux disease without esophagitis: Secondary | ICD-10-CM | POA: Diagnosis not present

## 2022-07-08 DIAGNOSIS — Z87891 Personal history of nicotine dependence: Secondary | ICD-10-CM | POA: Diagnosis not present

## 2022-07-08 DIAGNOSIS — R220 Localized swelling, mass and lump, head: Secondary | ICD-10-CM | POA: Diagnosis present

## 2022-07-08 DIAGNOSIS — I10 Essential (primary) hypertension: Secondary | ICD-10-CM | POA: Diagnosis not present

## 2022-07-08 DIAGNOSIS — R030 Elevated blood-pressure reading, without diagnosis of hypertension: Secondary | ICD-10-CM

## 2022-07-08 HISTORY — DX: Gastro-esophageal reflux disease without esophagitis: K21.9

## 2022-07-08 HISTORY — DX: Unspecified osteoarthritis, unspecified site: M19.90

## 2022-07-08 HISTORY — DX: Hyperlipidemia, unspecified: E78.5

## 2022-07-08 HISTORY — DX: Essential (primary) hypertension: I10

## 2022-07-08 HISTORY — PX: MASS EXCISION: SHX2000

## 2022-07-08 SURGERY — EXCISION MASS
Anesthesia: General | Site: Face | Laterality: Left

## 2022-07-08 MED ORDER — LIDOCAINE HCL (CARDIAC) PF 100 MG/5ML IV SOSY
PREFILLED_SYRINGE | INTRAVENOUS | Status: DC | PRN
  Administered 2022-07-08: 60 mg via INTRAVENOUS

## 2022-07-08 MED ORDER — CIPROFLOXACIN IN D5W 400 MG/200ML IV SOLN
400.0000 mg | INTRAVENOUS | Status: AC
  Administered 2022-07-08: 400 mg via INTRAVENOUS

## 2022-07-08 MED ORDER — FENTANYL CITRATE (PF) 100 MCG/2ML IJ SOLN: 25.0000 ug | INTRAMUSCULAR | Status: DC | PRN

## 2022-07-08 MED ORDER — ACETAMINOPHEN 500 MG PO TABS
1000.0000 mg | ORAL_TABLET | Freq: Once | ORAL | Status: AC
  Administered 2022-07-08: 1000 mg via ORAL

## 2022-07-08 MED ORDER — BACITRACIN ZINC 500 UNIT/GM EX OINT
TOPICAL_OINTMENT | CUTANEOUS | Status: AC
  Filled 2022-07-08: qty 0.9

## 2022-07-08 MED ORDER — BUPIVACAINE HCL (PF) 0.25 % IJ SOLN
INTRAMUSCULAR | Status: AC
  Filled 2022-07-08: qty 30

## 2022-07-08 MED ORDER — HYDROCODONE-ACETAMINOPHEN 5-325 MG PO TABS: 1.0000 | ORAL_TABLET | Freq: Four times a day (QID) | ORAL | 0 refills | Status: DC | PRN

## 2022-07-08 MED ORDER — ONDANSETRON HCL 4 MG/2ML IJ SOLN
INTRAMUSCULAR | Status: AC
  Filled 2022-07-08: qty 2

## 2022-07-08 MED ORDER — AMISULPRIDE (ANTIEMETIC) 5 MG/2ML IV SOLN
INTRAVENOUS | Status: AC
  Filled 2022-07-08: qty 4

## 2022-07-08 MED ORDER — PROMETHAZINE HCL 25 MG/ML IJ SOLN
6.2500 mg | INTRAMUSCULAR | Status: DC | PRN
  Administered 2022-07-08: 12.5 mg via INTRAVENOUS

## 2022-07-08 MED ORDER — MIDAZOLAM HCL 2 MG/2ML IJ SOLN
INTRAMUSCULAR | Status: AC
  Filled 2022-07-08: qty 2

## 2022-07-08 MED ORDER — LIDOCAINE 2% (20 MG/ML) 5 ML SYRINGE
INTRAMUSCULAR | Status: AC
  Filled 2022-07-08: qty 5

## 2022-07-08 MED ORDER — KETOROLAC TROMETHAMINE 30 MG/ML IJ SOLN: 30.0000 mg | Freq: Once | INTRAMUSCULAR | Status: DC

## 2022-07-08 MED ORDER — LIDOCAINE-EPINEPHRINE 1 %-1:100000 IJ SOLN
INTRAMUSCULAR | Status: AC
  Filled 2022-07-08: qty 1

## 2022-07-08 MED ORDER — EPINEPHRINE PF 1 MG/ML IJ SOLN
INTRAMUSCULAR | Status: AC
  Filled 2022-07-08: qty 1

## 2022-07-08 MED ORDER — AMISULPRIDE (ANTIEMETIC) 5 MG/2ML IV SOLN
10.0000 mg | Freq: Once | INTRAVENOUS | Status: AC | PRN
  Administered 2022-07-08: 10 mg via INTRAVENOUS

## 2022-07-08 MED ORDER — ONDANSETRON HCL 4 MG/2ML IJ SOLN
INTRAMUSCULAR | Status: DC | PRN
  Administered 2022-07-08: 4 mg via INTRAVENOUS

## 2022-07-08 MED ORDER — FENTANYL CITRATE (PF) 100 MCG/2ML IJ SOLN
INTRAMUSCULAR | Status: DC | PRN
  Administered 2022-07-08: 50 ug via INTRAVENOUS
  Administered 2022-07-08: 25 ug via INTRAVENOUS

## 2022-07-08 MED ORDER — PROPOFOL 10 MG/ML IV BOLUS
INTRAVENOUS | Status: DC | PRN
  Administered 2022-07-08: 200 mg via INTRAVENOUS

## 2022-07-08 MED ORDER — OXYCODONE HCL 5 MG/5ML PO SOLN: 5.0000 mg | Freq: Once | ORAL | Status: DC | PRN

## 2022-07-08 MED ORDER — LACTATED RINGERS IV SOLN: INTRAVENOUS | Status: DC

## 2022-07-08 MED ORDER — ACETAMINOPHEN 500 MG PO TABS
ORAL_TABLET | ORAL | Status: AC
  Filled 2022-07-08: qty 2

## 2022-07-08 MED ORDER — PROMETHAZINE HCL 25 MG/ML IJ SOLN
INTRAMUSCULAR | Status: AC
  Filled 2022-07-08: qty 1

## 2022-07-08 MED ORDER — BUPIVACAINE HCL (PF) 0.25 % IJ SOLN
INTRAMUSCULAR | Status: DC | PRN
  Administered 2022-07-08: 4 mL

## 2022-07-08 MED ORDER — MIDAZOLAM HCL 5 MG/5ML IJ SOLN
INTRAMUSCULAR | Status: DC | PRN
  Administered 2022-07-08: 2 mg via INTRAVENOUS

## 2022-07-08 MED ORDER — DEXAMETHASONE SODIUM PHOSPHATE 4 MG/ML IJ SOLN
INTRAMUSCULAR | Status: DC | PRN
  Administered 2022-07-08: 8 mg via INTRAVENOUS

## 2022-07-08 MED ORDER — DEXAMETHASONE SODIUM PHOSPHATE 10 MG/ML IJ SOLN
INTRAMUSCULAR | Status: AC
  Filled 2022-07-08: qty 1

## 2022-07-08 MED ORDER — BUPIVACAINE-EPINEPHRINE (PF) 0.5% -1:200000 IJ SOLN
INTRAMUSCULAR | Status: AC
  Filled 2022-07-08: qty 30

## 2022-07-08 MED ORDER — CIPROFLOXACIN IN D5W 400 MG/200ML IV SOLN
INTRAVENOUS | Status: AC
  Filled 2022-07-08: qty 200

## 2022-07-08 MED ORDER — ACETAMINOPHEN 10 MG/ML IV SOLN: 1000.0000 mg | Freq: Once | INTRAVENOUS | Status: DC | PRN

## 2022-07-08 MED ORDER — OXYCODONE HCL 5 MG PO TABS: 5.0000 mg | ORAL_TABLET | Freq: Once | ORAL | Status: DC | PRN

## 2022-07-08 MED ORDER — FENTANYL CITRATE (PF) 100 MCG/2ML IJ SOLN
INTRAMUSCULAR | Status: AC
  Filled 2022-07-08: qty 2

## 2022-07-08 SURGICAL SUPPLY — 62 items
ADH SKN CLS APL DERMABOND .7 (GAUZE/BANDAGES/DRESSINGS)
APL PRP STRL LF DISP 70% ISPRP (MISCELLANEOUS) ×1
APL SKNCLS STERI-STRIP NONHPOA (GAUZE/BANDAGES/DRESSINGS)
BAND INSRT 18 STRL LF DISP RB (MISCELLANEOUS)
BAND RUBBER #18 3X1/16 STRL (MISCELLANEOUS) IMPLANT
BENZOIN TINCTURE PRP APPL 2/3 (GAUZE/BANDAGES/DRESSINGS) IMPLANT
BLADE CLIPPER SURG (BLADE) IMPLANT
BLADE SURG 11 STRL SS (BLADE) IMPLANT
BLADE SURG 15 STRL LF DISP TIS (BLADE) ×1 IMPLANT
BLADE SURG 15 STRL SS (BLADE) ×1
CANISTER SUCT 1200ML W/VALVE (MISCELLANEOUS) IMPLANT
CHLORAPREP W/TINT 26 (MISCELLANEOUS) ×1 IMPLANT
COVER BACK TABLE 60X90IN (DRAPES) ×1 IMPLANT
COVER MAYO STAND STRL (DRAPES) ×1 IMPLANT
DERMABOND ADVANCED .7 DNX12 (GAUZE/BANDAGES/DRESSINGS) IMPLANT
DRAIN JP 10F RND SILICONE (MISCELLANEOUS) IMPLANT
DRAPE LAPAROTOMY 100X72 PEDS (DRAPES) IMPLANT
DRAPE U-SHAPE 76X120 STRL (DRAPES) IMPLANT
DRAPE UTILITY XL STRL (DRAPES) ×1 IMPLANT
DRSG TELFA 3X8 NADH STRL (GAUZE/BANDAGES/DRESSINGS) IMPLANT
ELECT COATED BLADE 2.86 ST (ELECTRODE) IMPLANT
ELECT NDL BLADE 2-5/6 (NEEDLE) ×1 IMPLANT
ELECT NEEDLE BLADE 2-5/6 (NEEDLE) ×1 IMPLANT
ELECT REM PT RETURN 9FT ADLT (ELECTROSURGICAL)
ELECT REM PT RETURN 9FT PED (ELECTROSURGICAL)
ELECTRODE REM PT RETRN 9FT PED (ELECTROSURGICAL) IMPLANT
ELECTRODE REM PT RTRN 9FT ADLT (ELECTROSURGICAL) IMPLANT
EVACUATOR SILICONE 100CC (DRAIN) IMPLANT
GAUZE SPONGE 4X4 12PLY STRL LF (GAUZE/BANDAGES/DRESSINGS) IMPLANT
GAUZE XEROFORM 1X8 LF (GAUZE/BANDAGES/DRESSINGS) IMPLANT
GLOVE BIO SURGEON STRL SZ 6 (GLOVE) ×1 IMPLANT
GOWN STRL REUS W/ TWL LRG LVL3 (GOWN DISPOSABLE) ×2 IMPLANT
GOWN STRL REUS W/TWL LRG LVL3 (GOWN DISPOSABLE) ×2
NDL HYPO 27GX1-1/4 (NEEDLE) ×1 IMPLANT
NDL HYPO 30GX1 BEV (NEEDLE) IMPLANT
NEEDLE HYPO 27GX1-1/4 (NEEDLE) ×1 IMPLANT
NEEDLE HYPO 30GX1 BEV (NEEDLE) IMPLANT
NS IRRIG 1000ML POUR BTL (IV SOLUTION) IMPLANT
PACK BASIN DAY SURGERY FS (CUSTOM PROCEDURE TRAY) ×1 IMPLANT
PENCIL SMOKE EVACUATOR (MISCELLANEOUS) ×1 IMPLANT
SHEET MEDIUM DRAPE 40X70 STRL (DRAPES) IMPLANT
SLEEVE SCD COMPRESS KNEE MED (STOCKING) IMPLANT
SPONGE GAUZE 2X2 8PLY STRL LF (GAUZE/BANDAGES/DRESSINGS) IMPLANT
SPONGE T-LAP 18X18 ~~LOC~~+RFID (SPONGE) IMPLANT
STAPLER VISISTAT 35W (STAPLE) ×1 IMPLANT
STRIP CLOSURE SKIN 1/2X4 (GAUZE/BANDAGES/DRESSINGS) IMPLANT
SUCTION FRAZIER HANDLE 10FR (MISCELLANEOUS)
SUCTION TUBE FRAZIER 10FR DISP (MISCELLANEOUS) IMPLANT
SUT ETHILON 4 0 PS 2 18 (SUTURE) IMPLANT
SUT MNCRL AB 4-0 PS2 18 (SUTURE) IMPLANT
SUT MON AB 5-0 P3 18 (SUTURE) IMPLANT
SUT PLAIN 5 0 P 3 18 (SUTURE) IMPLANT
SUT PROLENE 5 0 P 3 (SUTURE) IMPLANT
SUT PROLENE 6 0 P 1 18 (SUTURE) IMPLANT
SUT VICRYL 4-0 PS2 18IN ABS (SUTURE) IMPLANT
SWAB COLLECTION DEVICE MRSA (MISCELLANEOUS) IMPLANT
SWAB CULTURE ESWAB REG 1ML (MISCELLANEOUS) IMPLANT
SYR BULB EAR ULCER 3OZ GRN STR (SYRINGE) IMPLANT
SYR CONTROL 10ML LL (SYRINGE) ×1 IMPLANT
TOWEL GREEN STERILE FF (TOWEL DISPOSABLE) ×1 IMPLANT
TRAY DSU PREP LF (CUSTOM PROCEDURE TRAY) IMPLANT
TUBE CONNECTING 20X1/4 (TUBING) IMPLANT

## 2022-07-08 NOTE — Anesthesia Preprocedure Evaluation (Addendum)
Anesthesia Evaluation  Patient identified by MRN, date of birth, ID band Patient awake    Reviewed: Allergy & Precautions, NPO status , Patient's Chart, lab work & pertinent test results  Airway Mallampati: III  TM Distance: >3 FB Neck ROM: Full    Dental  (+) Chipped, Poor Dentition,    Pulmonary former smoker   Pulmonary exam normal        Cardiovascular hypertension, Pt. on medications Normal cardiovascular exam     Neuro/Psych negative neurological ROS  negative psych ROS   GI/Hepatic Neg liver ROS,GERD  Medicated and Controlled,,  Endo/Other  negative endocrine ROS    Renal/GU negative Renal ROS     Musculoskeletal  (+) Arthritis ,    Abdominal   Peds  Hematology negative hematology ROS (+)   Anesthesia Other Findings SOFT TISSUE MASS FACE  Reproductive/Obstetrics                             Anesthesia Physical Anesthesia Plan  ASA: 2  Anesthesia Plan: General   Post-op Pain Management:    Induction: Intravenous  PONV Risk Score and Plan: 3 and Ondansetron, Dexamethasone, Midazolam and Treatment may vary due to age or medical condition  Airway Management Planned: LMA  Additional Equipment:   Intra-op Plan:   Post-operative Plan: Extubation in OR  Informed Consent: I have reviewed the patients History and Physical, chart, labs and discussed the procedure including the risks, benefits and alternatives for the proposed anesthesia with the patient or authorized representative who has indicated his/her understanding and acceptance.     Dental advisory given  Plan Discussed with: CRNA  Anesthesia Plan Comments:        Anesthesia Quick Evaluation

## 2022-07-08 NOTE — Anesthesia Postprocedure Evaluation (Signed)
Anesthesia Post Note  Patient: Traci Caldwell  Procedure(s) Performed: EXCISION SUBCUTANEOUS vs. SUBFASCIAL MASS FACE 4CM (Left: Face)     Patient location during evaluation: PACU Anesthesia Type: General Level of consciousness: awake Pain management: pain level controlled Vital Signs Assessment: post-procedure vital signs reviewed and stable Respiratory status: spontaneous breathing, nonlabored ventilation and respiratory function stable Cardiovascular status: blood pressure returned to baseline and stable Postop Assessment: no apparent nausea or vomiting Anesthetic complications: no   No notable events documented.  Last Vitals:  Vitals:   07/08/22 0900 07/08/22 0929  BP: 132/89 (!) 148/70  Pulse: 66 68  Resp: 14 20  Temp:  36.7 C  SpO2: 93% 94%    Last Pain:  Vitals:   07/08/22 0929  TempSrc: Oral  PainSc: 0-No pain                 Kristeena Meineke P Annina Piotrowski

## 2022-07-08 NOTE — H&P (Signed)
  Subjective:  Patient ID: Traci Caldwell is a 56 y.o. female.  HPI  Referred by Dr. Sabra Heck for evaluation facial mass present over over 15 years. Patient notes she quit smoking due to mass as she was worried was cancer. No prior biopsy or intervention. Consultation with ENT completed 2019 with [place] as below. Recommended observation.  Lives with spouse. 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  CLINICAL DATA: Neck mass, "cystic" lesion, per MD, LEFT mandible, duration of 10 years.   EXAM: ULTRASOUND OF HEAD/NECK SOFT TISSUES   TECHNIQUE: Ultrasound examination of the head and neck soft tissues was performed in the area of clinical concern.   COMPARISON: None.   FINDINGS: Poorly defined subcutaneous lesion having similar echotexture of fat, roughly 2.3 x 0.9 x 3.2 cm, suspected lipoma. Irregular septae course through the lesion. No well-defined capsule. No features to suggest lymph node or submandibular gland enlargement.   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 No well-defined capsule. Irregular septae course through the lesion. Recommend CT neck with contrast for further characterization.     Electronically Signed By: Staci Righter M.D. On: 06/01/2018 16:28    Review of Systems Allergic/Immunologic: Positive for environmental allergies. Hematological: Bruises/bleeds easily.  Remainder 12 point review negative  Objective: Physical Exam Cardiovascular: Rate and Rhythm: Normal rate and regular rhythm. Heart sounds: Normal heart sounds. Pulmonary: Effort: Pulmonary effort is normal. Breath sounds: Normal breath sounds. Skin: Comments: Fitzpatrick 2 Neurological: Mental Status: She is oriented to person, place, and time.  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 Reviewed excision in OR. Reviewed location mass risks injury to branch of facial nerve. This could lead to asymmetry smile, risk of this is permanent. Additional risks include recurrence, need for additional procedures pending pathology. Reviewed scar below mandibular border may be less visible but this wound be closer to nerve of concern. More visible scar over anterior face would be bit farther away from this nerve. Patient ok with latter approach understands risk in any form to facial nerve.  Reviewed OP surgery GA post op limitations and sutures.

## 2022-07-08 NOTE — Discharge Instructions (Signed)
  Post Anesthesia Home Care Instructions  Activity: Get plenty of rest for the remainder of the day. A responsible individual must stay with you for 24 hours following the procedure.  For the next 24 hours, DO NOT: -Drive a car -Paediatric nurse -Drink alcoholic beverages -Take any medication unless instructed by your physician -Make any legal decisions or sign important papers.  Meals: Start with liquid foods such as gelatin or soup. Progress to regular foods as tolerated. Avoid greasy, spicy, heavy foods. If nausea and/or vomiting occur, drink only clear liquids until the nausea and/or vomiting subsides. Call your physician if vomiting continues.  Special Instructions/Symptoms: Your throat may feel dry or sore from the anesthesia or the breathing tube placed in your throat during surgery. If this causes discomfort, gargle with warm salt water. The discomfort should disappear within 24 hours.  Can take Tylenol after 1pm

## 2022-07-08 NOTE — Op Note (Signed)
Operative Note   DATE OF OPERATION: 12.26.23  LOCATION: [facility]-outpatient  SURGICAL DIVISION: Plastic Surgery  PREOPERATIVE DIAGNOSES:  soft tissue mass left cheek  POSTOPERATIVE DIAGNOSES:  same  PROCEDURE:  Excision subcutaneous mass left cheek 4 cm  SURGEON: Irene Limbo MD MBA  ASSISTANT: none  ANESTHESIA:  General.   EBL: 5 ml  COMPLICATIONS: None immediate.   INDICATIONS FOR PROCEDURE:  The patient, Traci Caldwell, is a 56 y.o. female born on [birth year], is here for excision mass left cheek present for several years with continued growth.   FINDINGS: Clinically lipoma present superficial to facial musculature.  DESCRIPTION OF PROCEDURE:  The patient's operative site was marked with the patient in the preoperative area. The patient was taken to the operating room. SCDs were placed and IV antibiotics were given. The patient's operative site was prepped and draped in a sterile fashion. A time out was performed and all information was confirmed to be correct. Local anesthetic infiltrated to perform left mental and infraorbital nerve blocks. Incision made in natural skin tension line over mass. Skin flaps elevated off mass. Mass dissected bluntly off underlying musculature. Mass excised diameter 4 cm. Wound irrigated. Hemostasis obtained. Closure completed with 5-0 monocryl in dermis. Skin closure completed with running 5-0 prolene suture. Antibiotic ointment applied.   The patient was allowed to wake from anesthesia, extubated and taken to the recovery room in satisfactory condition.   SPECIMENS: left cheek mass  DRAINS: none  Irene Limbo, MD [facility] Plastic & Reconstructive Surgery  Office/ physician access line after hours [phone]

## 2022-07-08 NOTE — Transfer of Care (Signed)
Immediate Anesthesia Transfer of Care Note  Patient: Traci Caldwell  Procedure(s) Performed: EXCISION SUBCUTANEOUS vs. SUBFASCIAL MASS FACE 4CM (Left: Face)  Patient Location: PACU  Anesthesia Type:General  Level of Consciousness: drowsy and patient cooperative  Airway & Oxygen Therapy: Patient Spontanous Breathing and Patient connected to face mask oxygen  Post-op Assessment: Report given to RN and Post -op Vital signs reviewed and stable  Post vital signs: Reviewed and stable  Last Vitals:  Vitals Value Taken Time  BP    Temp    Pulse    Resp    SpO2      Last Pain:  Vitals:   07/08/22 0932  TempSrc: Oral  PainSc: 0-No pain         Complications: No notable events documented.

## 2022-07-08 NOTE — Anesthesia Procedure Notes (Signed)
Procedure Name: LMA Insertion Date/Time: 07/08/2022 7:51 AM  Performed by: Tawni Millers, CRNAPre-anesthesia Checklist: Patient identified, Emergency Drugs available, Suction available and Patient being monitored Patient Re-evaluated:Patient Re-evaluated prior to induction Oxygen Delivery Method: Circle system utilized Preoxygenation: Pre-oxygenation with 100% oxygen Induction Type: IV induction Ventilation: Mask ventilation without difficulty LMA: LMA inserted LMA Size: 3.0 Number of attempts: 1 Airway Equipment and Method: Bite block Placement Confirmation: positive ETCO2 Tube secured with: Tape Dental Injury: Teeth and Oropharynx as per pre-operative assessment

## 2022-07-09 ENCOUNTER — Encounter (HOSPITAL_BASED_OUTPATIENT_CLINIC_OR_DEPARTMENT_OTHER): Payer: Self-pay | Admitting: Plastic Surgery

## 2022-07-09 LAB — SURGICAL PATHOLOGY

## 2022-09-10 ENCOUNTER — Other Ambulatory Visit: Payer: Self-pay | Admitting: Family Medicine

## 2022-09-10 DIAGNOSIS — Z1231 Encounter for screening mammogram for malignant neoplasm of breast: Secondary | ICD-10-CM

## 2022-09-17 ENCOUNTER — Other Ambulatory Visit (HOSPITAL_BASED_OUTPATIENT_CLINIC_OR_DEPARTMENT_OTHER): Payer: Self-pay | Admitting: Obstetrics & Gynecology

## 2022-09-17 DIAGNOSIS — Z1211 Encounter for screening for malignant neoplasm of colon: Secondary | ICD-10-CM

## 2022-10-25 ENCOUNTER — Encounter (HOSPITAL_BASED_OUTPATIENT_CLINIC_OR_DEPARTMENT_OTHER): Payer: Self-pay | Admitting: Obstetrics & Gynecology

## 2022-10-26 ENCOUNTER — Other Ambulatory Visit (HOSPITAL_BASED_OUTPATIENT_CLINIC_OR_DEPARTMENT_OTHER): Payer: Self-pay | Admitting: Obstetrics & Gynecology

## 2022-10-27 LAB — COLOGUARD: COLOGUARD: NEGATIVE

## 2022-11-10 ENCOUNTER — Ambulatory Visit
Admission: RE | Admit: 2022-11-10 | Discharge: 2022-11-10 | Disposition: A | Discharge status: Home / Self Care | Payer: BC Managed Care – PPO | Source: Ambulatory Visit | Attending: Family Medicine | Admitting: Family Medicine

## 2022-11-10 DIAGNOSIS — Z1231 Encounter for screening mammogram for malignant neoplasm of breast: Secondary | ICD-10-CM

## 2022-11-12 ENCOUNTER — Other Ambulatory Visit: Payer: Self-pay | Admitting: Family Medicine

## 2022-11-12 DIAGNOSIS — R928 Other abnormal and inconclusive findings on diagnostic imaging of breast: Secondary | ICD-10-CM

## 2022-11-20 ENCOUNTER — Ambulatory Visit: Payer: BC Managed Care – PPO

## 2022-11-20 ENCOUNTER — Ambulatory Visit
Admission: RE | Admit: 2022-11-20 | Discharge: 2022-11-20 | Disposition: A | Discharge status: Home / Self Care | Payer: BC Managed Care – PPO | Source: Ambulatory Visit | Attending: Family Medicine | Admitting: Family Medicine

## 2022-11-20 DIAGNOSIS — R928 Other abnormal and inconclusive findings on diagnostic imaging of breast: Secondary | ICD-10-CM

## 2023-03-21 ENCOUNTER — Encounter (HOSPITAL_BASED_OUTPATIENT_CLINIC_OR_DEPARTMENT_OTHER): Payer: Self-pay | Admitting: Obstetrics & Gynecology

## 2023-05-06 NOTE — Progress Notes (Signed)
57 y.o. G41P2002 Married White or Caucasian female here for annual exam.  Doing well.  Brought labs from work.  This was all normal.  Cholesterol and HbA1c were normal.  Blood pressures are normal when not in doctor's office.  She has been monitoring this.    Patient's last menstrual period was 12/13/2007.          Sexually active: Yes.    The current method of family planning is status post hysterectomy.    Smoker:  no  Health Maintenance: Pap:  01/24/2021 Negative History of abnormal Pap:  no MMG:  11/20/2022 Negative Colonoscopy:  cologuard neg 10/2022 BMD:  11/2021 Screening Labs: done with work   reports that she quit smoking about 12 years ago. Her smoking use included cigarettes. She started smoking about 32 years ago. She has a 20 pack-year smoking history. She has never used smokeless tobacco. She reports that she does not drink alcohol and does not use drugs.  Past Medical History:  Diagnosis Date   Arthritis    bil hips, rt hand   CIN I (cervical intraepithelial neoplasia I) 08/2004   resolved spontaneously   GERD (gastroesophageal reflux disease)    Hyperlipidemia    Hypertension    T12 compression fracture (HCC)    traumatic    Past Surgical History:  Procedure Laterality Date   COLPOSCOPY  08/2004   CIN I   MASS EXCISION Left 07/08/2022   Procedure: EXCISION SUBCUTANEOUS vs. SUBFASCIAL MASS FACE 4CM;  Surgeon: Glenna Fellows, MD;  Location: [facility];  Service: Plastics;  Laterality: Left;   TUBAL LIGATION  1996   VAGINAL HYSTERECTOMY  12/2007   TVH- due to fibroids & bleeding    Current Outpatient Medications  Medication Sig Dispense Refill   amLODipine (NORVASC) 10 MG tablet Take 10 mg by mouth.     aspirin EC 81 MG tablet Take 81 mg by mouth daily.     Ibuprofen (ADVIL PO) Take by mouth as needed.     losartan (COZAAR) 25 MG tablet Take 25 mg by mouth daily.     Multiple Vitamin (MULTIVITAMIN WITH MINERALS) TABS tablet Take 1 tablet by  mouth daily.     omeprazole (PRILOSEC) 20 MG capsule Take 20 mg by mouth daily.     REPATHA SURECLICK 140 MG/ML SOAJ Inject 140 mg into the skin.     No current facility-administered medications for this visit.    Family History  Problem Relation Age of Onset   Hypertension Mother    Depression Mother    Hypertension Father    Hyperlipidemia Father    Stroke Father    Endometriosis Sister    Diabetes Paternal Uncle    Cancer Maternal Grandmother        stomach   Hypertension Maternal Grandmother    Hypertension Maternal Grandfather    Diabetes Paternal Grandmother    Hypertension Paternal Grandmother    Hypertension Paternal Grandfather    Stroke Paternal Grandfather     ROS: Constitutional: negative Genitourinary:negative  Exam:   BP (!) 144/84 (BP Location: Right Arm, Patient Position: Sitting, Cuff Size: Normal)   Pulse 64   Ht 5\' 3"  (1.6 m)   Wt 186 lb 9.6 oz (84.6 kg)   LMP 12/13/2007   BMI 33.05 kg/m   Height: 5\' 3"  (160 cm)  General appearance: alert, cooperative and appears stated age Head: Normocephalic, without obvious abnormality, atraumatic Neck: no adenopathy, supple, symmetrical, trachea midline and thyroid normal to inspection  and palpation Breasts: normal appearance, no masses or tenderness Abdomen: soft, non-tender; bowel sounds normal; no masses,  no organomegaly Extremities: extremities normal, atraumatic, no cyanosis or edema Skin: Skin color, texture, turgor normal. No rashes or lesions Lymph nodes: Cervical, supraclavicular, and axillary nodes normal. No abnormal inguinal nodes palpated Neurologic: Grossly normal   Pelvic: External genitalia:  no lesions              Urethra:  normal appearing urethra with no masses, tenderness or lesions              Bartholins and Skenes: normal                 Vagina: normal appearing vagina with normal color and no discharge, no lesions              Cervix: absent              Pap taken: No. Bimanual  Exam:  Uterus:  uterus absent              Adnexa: no mass, fullness, tenderness               Rectovaginal: Confirms               Anus:  normal sphincter tone, no lesions  Chaperone, Carolin Guernsey, CMA, was present for exam.  Assessment/Plan: 1. Well woman exam with routine gynecological exam - Pap smear not indicated - Mammogram 11/20/2022 - Colonoscopy declined but doing cologuard 10/2022 - Bone mineral density 2024 - lab work done with work - vaccines reviewed/updated  2. H/O: hysterectomy  3. Postmenopausal - not on HRT  4. Former smoker - doing low dosed CT for lung cancer screening yearly  5. White coat syndrome with hypertension - on two medications for hypertension

## 2023-05-08 ENCOUNTER — Ambulatory Visit (INDEPENDENT_AMBULATORY_CARE_PROVIDER_SITE_OTHER): Payer: BC Managed Care – PPO | Admitting: Obstetrics & Gynecology

## 2023-05-08 ENCOUNTER — Encounter (HOSPITAL_BASED_OUTPATIENT_CLINIC_OR_DEPARTMENT_OTHER): Payer: Self-pay | Admitting: Obstetrics & Gynecology

## 2023-05-08 VITALS — BP 144/84 | HR 64 | Ht 63.0 in | Wt 186.6 lb

## 2023-05-08 DIAGNOSIS — Z01419 Encounter for gynecological examination (general) (routine) without abnormal findings: Secondary | ICD-10-CM | POA: Diagnosis not present

## 2023-05-08 DIAGNOSIS — Z780 Asymptomatic menopausal state: Secondary | ICD-10-CM | POA: Diagnosis not present

## 2023-05-08 DIAGNOSIS — Z87891 Personal history of nicotine dependence: Secondary | ICD-10-CM

## 2023-05-08 DIAGNOSIS — Z90710 Acquired absence of both cervix and uterus: Secondary | ICD-10-CM

## 2023-05-08 DIAGNOSIS — R030 Elevated blood-pressure reading, without diagnosis of hypertension: Secondary | ICD-10-CM

## 2023-05-08 DIAGNOSIS — I10 Essential (primary) hypertension: Secondary | ICD-10-CM

## 2024-01-05 ENCOUNTER — Other Ambulatory Visit: Payer: Self-pay | Admitting: Family Medicine

## 2024-01-05 DIAGNOSIS — Z1231 Encounter for screening mammogram for malignant neoplasm of breast: Secondary | ICD-10-CM

## 2024-01-26 ENCOUNTER — Ambulatory Visit
Admission: RE | Admit: 2024-01-26 | Discharge: 2024-01-26 | Disposition: A | Discharge status: Home / Self Care | Source: Ambulatory Visit | Attending: Family Medicine | Admitting: Family Medicine

## 2024-01-26 DIAGNOSIS — Z1231 Encounter for screening mammogram for malignant neoplasm of breast: Secondary | ICD-10-CM

## 2024-01-29 ENCOUNTER — Encounter (HOSPITAL_BASED_OUTPATIENT_CLINIC_OR_DEPARTMENT_OTHER): Payer: Self-pay | Admitting: Obstetrics & Gynecology

## 2024-05-19 ENCOUNTER — Ambulatory Visit (HOSPITAL_BASED_OUTPATIENT_CLINIC_OR_DEPARTMENT_OTHER): Payer: BC Managed Care – PPO | Admitting: Obstetrics & Gynecology

## 2024-05-20 ENCOUNTER — Ambulatory Visit (HOSPITAL_BASED_OUTPATIENT_CLINIC_OR_DEPARTMENT_OTHER): Payer: BC Managed Care – PPO | Admitting: Obstetrics & Gynecology

## 2024-07-18 ENCOUNTER — Ambulatory Visit (HOSPITAL_BASED_OUTPATIENT_CLINIC_OR_DEPARTMENT_OTHER): Admitting: Obstetrics & Gynecology

## 2024-09-14 ENCOUNTER — Ambulatory Visit (HOSPITAL_BASED_OUTPATIENT_CLINIC_OR_DEPARTMENT_OTHER): Admitting: Obstetrics & Gynecology
